# Patient Record
Sex: Female | Born: 1950
Health system: Southern US, Community
[De-identification: ages and names within clinical notes are randomized; demographics above are authoritative.]

## PROBLEM LIST (undated history)

## (undated) DIAGNOSIS — E039 Hypothyroidism, unspecified: Secondary | ICD-10-CM

## (undated) DIAGNOSIS — K219 Gastro-esophageal reflux disease without esophagitis: Secondary | ICD-10-CM

## (undated) DIAGNOSIS — R51 Headache: Secondary | ICD-10-CM

## (undated) DIAGNOSIS — D649 Anemia, unspecified: Secondary | ICD-10-CM

## (undated) DIAGNOSIS — S0990XA Unspecified injury of head, initial encounter: Secondary | ICD-10-CM

## (undated) DIAGNOSIS — M549 Dorsalgia, unspecified: Secondary | ICD-10-CM

## (undated) DIAGNOSIS — Z5189 Encounter for other specified aftercare: Secondary | ICD-10-CM

## (undated) DIAGNOSIS — M1990 Unspecified osteoarthritis, unspecified site: Secondary | ICD-10-CM

## (undated) HISTORY — DX: Anemia, unspecified: D64.9

---
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
## 2008-04-13 ENCOUNTER — Ambulatory Visit (HOSPITAL_COMMUNITY): Admission: RE | Admit: 2008-04-13 | Discharge: 2008-04-13 | Payer: Self-pay | Admitting: Obstetrics and Gynecology

## 2011-01-09 ENCOUNTER — Emergency Department (HOSPITAL_COMMUNITY)
Admission: EM | Admit: 2011-01-09 | Discharge: 2011-01-09 | Disposition: A | Discharge status: Home / Self Care | Payer: No Typology Code available for payment source | Attending: Emergency Medicine | Admitting: Emergency Medicine

## 2011-01-09 ENCOUNTER — Emergency Department (HOSPITAL_COMMUNITY): Payer: No Typology Code available for payment source

## 2011-01-09 DIAGNOSIS — M549 Dorsalgia, unspecified: Secondary | ICD-10-CM | POA: Insufficient documentation

## 2011-01-09 DIAGNOSIS — E039 Hypothyroidism, unspecified: Secondary | ICD-10-CM | POA: Insufficient documentation

## 2011-01-09 DIAGNOSIS — IMO0002 Reserved for concepts with insufficient information to code with codable children: Secondary | ICD-10-CM | POA: Insufficient documentation

## 2011-01-09 DIAGNOSIS — M545 Low back pain, unspecified: Secondary | ICD-10-CM | POA: Insufficient documentation

## 2011-01-09 DIAGNOSIS — R079 Chest pain, unspecified: Secondary | ICD-10-CM | POA: Insufficient documentation

## 2011-01-09 DIAGNOSIS — T148XXA Other injury of unspecified body region, initial encounter: Secondary | ICD-10-CM | POA: Insufficient documentation

## 2011-01-09 DIAGNOSIS — R51 Headache: Secondary | ICD-10-CM | POA: Insufficient documentation

## 2011-01-09 DIAGNOSIS — M542 Cervicalgia: Secondary | ICD-10-CM | POA: Insufficient documentation

## 2011-02-05 ENCOUNTER — Other Ambulatory Visit: Payer: Self-pay

## 2011-03-24 ENCOUNTER — Other Ambulatory Visit (HOSPITAL_COMMUNITY)
Admission: RE | Admit: 2011-03-24 | Discharge: 2011-03-24 | Disposition: A | Discharge status: Home / Self Care | Payer: BC Managed Care – PPO | Source: Ambulatory Visit | Attending: Endocrinology | Admitting: Endocrinology

## 2011-03-24 ENCOUNTER — Other Ambulatory Visit: Payer: Self-pay | Admitting: Endocrinology

## 2011-03-24 DIAGNOSIS — Z01419 Encounter for gynecological examination (general) (routine) without abnormal findings: Secondary | ICD-10-CM | POA: Insufficient documentation

## 2011-06-01 ENCOUNTER — Encounter (HOSPITAL_COMMUNITY)
Admission: RE | Admit: 2011-06-01 | Discharge: 2011-06-01 | Disposition: A | Discharge status: Home / Self Care | Payer: BC Managed Care – PPO | Source: Ambulatory Visit | Attending: Orthopedic Surgery | Admitting: Orthopedic Surgery

## 2011-06-01 ENCOUNTER — Encounter (HOSPITAL_COMMUNITY): Payer: Self-pay

## 2011-06-01 DIAGNOSIS — Z01812 Encounter for preprocedural laboratory examination: Secondary | ICD-10-CM | POA: Insufficient documentation

## 2011-06-01 DIAGNOSIS — Z538 Procedure and treatment not carried out for other reasons: Secondary | ICD-10-CM | POA: Insufficient documentation

## 2011-06-01 HISTORY — DX: Headache: R51

## 2011-06-01 HISTORY — DX: Hypothyroidism, unspecified: E03.9

## 2011-06-01 HISTORY — DX: Gastro-esophageal reflux disease without esophagitis: K21.9

## 2011-06-01 HISTORY — DX: Encounter for other specified aftercare: Z51.89

## 2011-06-01 HISTORY — DX: Unspecified osteoarthritis, unspecified site: M19.90

## 2011-06-01 HISTORY — DX: Dorsalgia, unspecified: M54.9

## 2011-06-01 HISTORY — DX: Unspecified injury of head, initial encounter: S09.90XA

## 2011-06-01 NOTE — Pre-Procedure Instructions (Signed)
PT ARRIVED TODAY FOR HER PREOP VISIT--SURGERY SCHEDULED FOR MON 06/08/11.  PT STATES SHE SAW DR. Juleen China THURS 05/28/11 FOR SURGICAL CLEARANCE--BUT HER BLOOD PRESSURE WAS ELEVATED AND SHE WAS NOT GIVEN CLEARANCE.  STATES SHE IS TO RETURN TO DR. Marylen Ponto OFFICE WED 2/27 FOR RECHECK OF HER BLOOD PRESSURE.  PT STATES SHE DOES NOT WANT ANY LAB TESTING DONE TODAY --THAT SHOULD HER SURGERY BE CANCELLED SHE DOES NOT WANT TO HAVE TO PAY FOR LAB TESTING.  PT'S MEDICAL HX WAS OBTAINED AND PT GIVEN PREOP INSTRUCTIONS FOR DAY OF SURGERY AND RX FOR MUPIROCIN OINTMENT (IF SHE HAS POSITIVE PCR).  PT TO CALL ME IF SHE GETS CLEARANCE--AND SET UP APPOINTMENT TO DO HER PRE OP LAB TESTING.

## 2011-06-01 NOTE — Patient Instructions (Signed)
20 Jasmine Donovan  06/01/2011   Your procedure is scheduled on:  Monday 3/4  AT 7:30 AM  Report to [facility] at 5:30 AM.  Call this number if you have problems the morning of surgery: [phone]   Remember:   Do not eat food OR DRINK ANYTHING AFTER MIDNIGHT THE NIGHT BEFORE YOUR SURGERY.    Take these medicines the morning of surgery with A SIP OF WATER: LEVOTHYROXINE   Do not wear jewelry, make-up or nail polish.  Do not wear lotions, powders, or perfumes.   Do not shave 48 hours prior to surgery.  Do not bring valuables to the hospital.  Contacts, dentures or bridgework may not be worn into surgery.  Leave suitcase in the car. After surgery it may be brought to your room.  For patients admitted to the hospital, checkout time is 11:00 AM the day of discharge.   Patients discharged the day of surgery will not be allowed to drive home  Special Instructions: CHG Shower Use Special Wash: 1/2 bottle night before surgery and 1/2 bottle morning of surgery.   Please read over the following fact sheets that you were given: Blood Transfusion Information and MRSA Information AND INCENTIVE SPIROMETER INFORMATION    CALL [name] RN  AT [facility] AT [phone]  AND LET PAT KNOW IF YOU GET CLEARANCE FOR YOUR SURGERY--AND MAKE APPOINTMENT TO COME BACK TO PRESURGICAL TESTING AT THE HOSPITAL FOR YOUR LAB TESTING.

## 2011-06-02 NOTE — Pre-Procedure Instructions (Signed)
  NOTICE RECEIVED FROM OR THAT PT'S SURGERY FOR 06/08/11 HAS BEEN CANCELLED. DR. Marylen Ponto OFFICE HAD FAXED CXR REPORT FROM 03/24/11 AND EKG REPORT FROM 05/28/2011--BOTH PLACED ON PT'S CHART.

## 2011-06-08 ENCOUNTER — Encounter (HOSPITAL_COMMUNITY): Admission: RE | Payer: Self-pay | Source: Ambulatory Visit

## 2011-06-08 ENCOUNTER — Inpatient Hospital Stay (HOSPITAL_COMMUNITY)
Admission: RE | Admit: 2011-06-08 | Payer: BC Managed Care – PPO | Source: Ambulatory Visit | Admitting: Orthopedic Surgery

## 2011-06-08 SURGERY — ARTHROPLASTY, KNEE, TOTAL
Anesthesia: Spinal | Site: Knee | Laterality: Right

## 2012-09-05 ENCOUNTER — Other Ambulatory Visit: Payer: Self-pay | Admitting: Endocrinology

## 2012-09-05 DIAGNOSIS — M7989 Other specified soft tissue disorders: Secondary | ICD-10-CM

## 2012-09-06 ENCOUNTER — Ambulatory Visit
Admission: RE | Admit: 2012-09-06 | Discharge: 2012-09-06 | Disposition: A | Discharge status: Home / Self Care | Payer: BC Managed Care – PPO | Source: Ambulatory Visit | Attending: Endocrinology | Admitting: Endocrinology

## 2012-09-06 DIAGNOSIS — M7989 Other specified soft tissue disorders: Secondary | ICD-10-CM

## 2012-09-21 ENCOUNTER — Other Ambulatory Visit: Payer: Self-pay | Admitting: Endocrinology

## 2012-09-21 DIAGNOSIS — R079 Chest pain, unspecified: Secondary | ICD-10-CM

## 2012-09-22 ENCOUNTER — Other Ambulatory Visit: Payer: Self-pay | Admitting: *Deleted

## 2012-09-22 DIAGNOSIS — R609 Edema, unspecified: Secondary | ICD-10-CM
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
## 2012-10-17 ENCOUNTER — Encounter: Payer: Self-pay | Admitting: Vascular Surgery

## 2012-10-18 ENCOUNTER — Encounter (INDEPENDENT_AMBULATORY_CARE_PROVIDER_SITE_OTHER): Payer: BC Managed Care – PPO | Admitting: Vascular Surgery

## 2012-10-18 ENCOUNTER — Encounter: Payer: Self-pay | Admitting: Vascular Surgery

## 2012-10-18 ENCOUNTER — Ambulatory Visit (INDEPENDENT_AMBULATORY_CARE_PROVIDER_SITE_OTHER): Payer: BC Managed Care – PPO | Admitting: Vascular Surgery

## 2012-10-18 VITALS — BP 167/85 | HR 63 | Resp 16 | Ht 65.5 in | Wt 187.0 lb

## 2012-10-18 DIAGNOSIS — R609 Edema, unspecified: Secondary | ICD-10-CM

## 2012-10-18 DIAGNOSIS — M79609 Pain in unspecified limb: Secondary | ICD-10-CM | POA: Insufficient documentation

## 2012-10-18 DIAGNOSIS — I83893 Varicose veins of bilateral lower extremities with other complications: Secondary | ICD-10-CM
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
## 2012-12-08 ENCOUNTER — Encounter: Payer: Self-pay | Admitting: Endocrinology
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
## 2012-12-26 ENCOUNTER — Encounter (HOSPITAL_COMMUNITY): Payer: Self-pay | Admitting: Pharmacy Technician

## 2012-12-28 NOTE — Patient Instructions (Signed)
Jawanda Passey  12/28/2012   Your procedure is scheduled on:  01/03/13               Surgery 0900am-1010am  Report to [facility] at     0600  AM.  Call this number if you have problems the morning of surgery: [phone]   Remember:   Do not eat food or drink liquids after midnight.   Take these medicines the morning of surgery with A SIP OF WATER:    Do not wear jewelry, make-up or nail polish.  Do not wear lotions, powders, or perfumes.  Do not shave 48 hours prior to surgery.   Do not bring valuables to the hospital.  Contacts, dentures or bridgework may not be worn into surgery.  Leave suitcase in the car. After surgery it may be brought to your room.  For patients admitted to the hospital, checkout time is 11:00 AM the day of  discharge.       SEE CHG INSTRUCTION SHEET    Please read over the following fact sheets that you were given: MRSA Information, coughing and deep breathing exercises, leg exercises, Blood Transfusion Fact Sheet, Incentive Spirometry Fact Sheet                Failure to comply with these instructions may result in cancellation of your surgery.                Patient Signature ____________________________              Nurse Signature _____________________________

## 2012-12-29 ENCOUNTER — Encounter (HOSPITAL_COMMUNITY): Payer: Self-pay

## 2012-12-29 ENCOUNTER — Encounter (HOSPITAL_COMMUNITY)
Admission: RE | Admit: 2012-12-29 | Discharge: 2012-12-29 | Disposition: A | Discharge status: Home / Self Care | Payer: BC Managed Care – PPO | Source: Ambulatory Visit | Attending: Orthopedic Surgery | Admitting: Orthopedic Surgery

## 2012-12-29 DIAGNOSIS — Z01812 Encounter for preprocedural laboratory examination: Secondary | ICD-10-CM | POA: Insufficient documentation

## 2012-12-29 DIAGNOSIS — Z01818 Encounter for other preprocedural examination: Secondary | ICD-10-CM | POA: Insufficient documentation

## 2012-12-29 LAB — CBC
HCT: 38.1 % (ref 36.0–46.0)
Hemoglobin: 12.4 g/dL (ref 12.0–15.0)
MCH: 28.4 pg (ref 26.0–34.0)
MCHC: 32.5 g/dL (ref 30.0–36.0)

## 2012-12-29 LAB — BASIC METABOLIC PANEL
BUN: 14 mg/dL (ref 6–23)
Chloride: 103 mEq/L (ref 96–112)
GFR calc Af Amer: 83 mL/min — ABNORMAL LOW (ref >90)
Glucose, Bld: 95 mg/dL (ref 70–99)
Potassium: 4.1 mEq/L (ref 3.5–5.1)

## 2012-12-29 LAB — ABO/RH: ABO/RH(D): A NEG

## 2012-12-29 LAB — URINALYSIS, ROUTINE W REFLEX MICROSCOPIC
Ketones, ur: NEGATIVE mg/dL
Leukocytes, UA: NEGATIVE
Nitrite: NEGATIVE
Protein, ur: NEGATIVE mg/dL
Urobilinogen, UA: 0.2 mg/dL (ref 0.0–1.0)
pH: 7 (ref 5.0–8.0)

## 2012-12-29 LAB — PROTIME-INR: INR: 0.91 (ref 0.00–1.49)

## 2012-12-30 NOTE — Progress Notes (Signed)
Patient's husband called and stated had not heard from office regarding results of PCR nasal swab.  Informed patient's husband that I would call office and refax results to office.  Refaxed PCR results to office by [facility] fax and also called office and office personnel when called [phone] prompt 2 then prompt 1 would notify Dr Charlann Boxer since he was in office today.

## 2013-01-03 ENCOUNTER — Ambulatory Visit (HOSPITAL_COMMUNITY): Payer: BC Managed Care – PPO | Admitting: Certified Registered Nurse Anesthetist

## 2013-01-03 ENCOUNTER — Encounter (HOSPITAL_COMMUNITY)
Admission: RE | Disposition: A | Discharge status: Skilled Nursing Facility | Payer: Self-pay | Source: Ambulatory Visit | Attending: Orthopedic Surgery

## 2013-01-03 ENCOUNTER — Inpatient Hospital Stay (HOSPITAL_COMMUNITY)
Admission: RE | Admit: 2013-01-03 | Discharge: 2013-01-06 | DRG: 209 | Disposition: A | Discharge status: Skilled Nursing Facility | Payer: BC Managed Care – PPO | Source: Ambulatory Visit | Attending: Orthopedic Surgery | Admitting: Orthopedic Surgery

## 2013-01-03 ENCOUNTER — Encounter (HOSPITAL_COMMUNITY): Payer: Self-pay | Admitting: Certified Registered Nurse Anesthetist

## 2013-01-03 ENCOUNTER — Encounter (HOSPITAL_COMMUNITY): Payer: Self-pay | Admitting: *Deleted

## 2013-01-03 DIAGNOSIS — M1710 Unilateral primary osteoarthritis, unspecified knee: Principal | ICD-10-CM | POA: Diagnosis present

## 2013-01-03 DIAGNOSIS — E669 Obesity, unspecified: Secondary | ICD-10-CM | POA: Diagnosis present

## 2013-01-03 DIAGNOSIS — D62 Acute posthemorrhagic anemia: Secondary | ICD-10-CM | POA: Diagnosis not present

## 2013-01-03 DIAGNOSIS — Z96651 Presence of right artificial knee joint: Secondary | ICD-10-CM

## 2013-01-03 DIAGNOSIS — Z96659 Presence of unspecified artificial knee joint: Secondary | ICD-10-CM

## 2013-01-03 DIAGNOSIS — D500 Iron deficiency anemia secondary to blood loss (chronic): Secondary | ICD-10-CM | POA: Diagnosis not present

## 2013-01-03 DIAGNOSIS — I10 Essential (primary) hypertension: Secondary | ICD-10-CM | POA: Diagnosis present

## 2013-01-03 DIAGNOSIS — Z880 Allergy status to penicillin: Secondary | ICD-10-CM

## 2013-01-03 DIAGNOSIS — Z6830 Body mass index (BMI) 30.0-30.9, adult: Secondary | ICD-10-CM

## 2013-01-03 DIAGNOSIS — K219 Gastro-esophageal reflux disease without esophagitis: Secondary | ICD-10-CM | POA: Diagnosis present

## 2013-01-03 DIAGNOSIS — E039 Hypothyroidism, unspecified: Secondary | ICD-10-CM | POA: Diagnosis present

## 2013-01-03 HISTORY — PX: TOTAL KNEE ARTHROPLASTY: SHX125

## 2013-01-03 LAB — TYPE AND SCREEN

## 2013-01-03 SURGERY — ARTHROPLASTY, KNEE, TOTAL
Anesthesia: Spinal | Site: Knee | Laterality: Right | Wound class: Clean

## 2013-01-03 MED ORDER — CLINDAMYCIN PHOSPHATE 900 MG/50ML IV SOLN
INTRAVENOUS | Status: AC
  Filled 2013-01-03: qty 50

## 2013-01-03 MED ORDER — METHOCARBAMOL 100 MG/ML IJ SOLN
500.0000 mg | Freq: Four times a day (QID) | INTRAVENOUS | Status: DC | PRN
  Administered 2013-01-03: 15:00:00 500 mg via INTRAVENOUS
  Filled 2013-01-03: qty 5

## 2013-01-03 MED ORDER — CLINDAMYCIN PHOSPHATE 900 MG/50ML IV SOLN
900.0000 mg | INTRAVENOUS | Status: AC
  Administered 2013-01-03: 900 mg via INTRAVENOUS

## 2013-01-03 MED ORDER — LIDOCAINE HCL (CARDIAC) 20 MG/ML IV SOLN
INTRAVENOUS | Status: DC | PRN
  Administered 2013-01-03: 100 mg via INTRAVENOUS

## 2013-01-03 MED ORDER — DEXAMETHASONE SODIUM PHOSPHATE 10 MG/ML IJ SOLN
10.0000 mg | Freq: Once | INTRAMUSCULAR | Status: AC
  Administered 2013-01-04: 08:00:00 10 mg via INTRAVENOUS
  Filled 2013-01-03: qty 1

## 2013-01-03 MED ORDER — PHENOL 1.4 % MT LIQD
1.0000 | OROMUCOSAL | Status: DC | PRN
  Filled 2013-01-03: qty 177

## 2013-01-03 MED ORDER — SODIUM CHLORIDE 0.9 % IV SOLN
INTRAVENOUS | Status: DC
  Administered 2013-01-03 – 2013-01-04 (×2): via INTRAVENOUS
  Filled 2013-01-03 (×13): qty 1000

## 2013-01-03 MED ORDER — ONDANSETRON HCL 4 MG/2ML IJ SOLN
INTRAMUSCULAR | Status: DC | PRN
  Administered 2013-01-03: 4 mg via INTRAVENOUS

## 2013-01-03 MED ORDER — TRANEXAMIC ACID 100 MG/ML IV SOLN
1000.0000 mg | Freq: Once | INTRAVENOUS | Status: AC
  Administered 2013-01-03: 1000 mg via INTRAVENOUS
  Filled 2013-01-03: qty 10

## 2013-01-03 MED ORDER — DEXAMETHASONE SODIUM PHOSPHATE 10 MG/ML IJ SOLN
10.0000 mg | Freq: Once | INTRAMUSCULAR | Status: AC
  Administered 2013-01-03: 10 mg via INTRAVENOUS

## 2013-01-03 MED ORDER — ASPIRIN EC 325 MG PO TBEC
325.0000 mg | DELAYED_RELEASE_TABLET | Freq: Two times a day (BID) | ORAL | Status: DC
  Administered 2013-01-04 – 2013-01-06 (×5): 325 mg via ORAL
  Filled 2013-01-03 (×7): qty 1

## 2013-01-03 MED ORDER — CLINDAMYCIN PHOSPHATE 600 MG/50ML IV SOLN
600.0000 mg | Freq: Four times a day (QID) | INTRAVENOUS | Status: AC
  Administered 2013-01-03 (×2): 600 mg via INTRAVENOUS
  Filled 2013-01-03 (×2): qty 50

## 2013-01-03 MED ORDER — FERROUS SULFATE 325 (65 FE) MG PO TABS
325.0000 mg | ORAL_TABLET | Freq: Three times a day (TID) | ORAL | Status: DC
Start: 2013-01-03 — End: 2013-01-06
  Administered 2013-01-03 – 2013-01-06 (×9): 325 mg via ORAL
  Filled 2013-01-03 (×11): qty 1

## 2013-01-03 MED ORDER — KETOROLAC TROMETHAMINE 30 MG/ML IJ SOLN
INTRAMUSCULAR | Status: AC
  Filled 2013-01-03: qty 1

## 2013-01-03 MED ORDER — HYDROMORPHONE HCL PF 1 MG/ML IJ SOLN: 0.2500 mg | INTRAMUSCULAR | Status: DC | PRN

## 2013-01-03 MED ORDER — SODIUM CHLORIDE 0.9 % IJ SOLN
INTRAMUSCULAR | Status: DC | PRN
  Administered 2013-01-03: 10:00:00

## 2013-01-03 MED ORDER — POLYETHYLENE GLYCOL 3350 17 G PO PACK
17.0000 g | PACK | Freq: Two times a day (BID) | ORAL | Status: DC
  Administered 2013-01-03 – 2013-01-05 (×5): 17 g via ORAL

## 2013-01-03 MED ORDER — CELECOXIB 200 MG PO CAPS
200.0000 mg | ORAL_CAPSULE | Freq: Two times a day (BID) | ORAL | Status: DC
  Administered 2013-01-03 – 2013-01-06 (×6): 200 mg via ORAL
  Filled 2013-01-03 (×8): qty 1

## 2013-01-03 MED ORDER — HYDROCODONE-ACETAMINOPHEN 7.5-325 MG PO TABS
1.0000 | ORAL_TABLET | ORAL | Status: DC
  Administered 2013-01-03 – 2013-01-05 (×12): 2 via ORAL
  Administered 2013-01-06: 05:00:00 1 via ORAL
  Administered 2013-01-06: 2 via ORAL
  Administered 2013-01-06 (×2): 1 via ORAL
  Filled 2013-01-03 (×5): qty 2
  Filled 2013-01-03: qty 1
  Filled 2013-01-03 (×3): qty 2
  Filled 2013-01-03: qty 1
  Filled 2013-01-03: qty 2
  Filled 2013-01-03: qty 1
  Filled 2013-01-03 (×5): qty 2

## 2013-01-03 MED ORDER — LEVOTHYROXINE SODIUM 125 MCG PO TABS
125.0000 ug | ORAL_TABLET | Freq: Every day | ORAL | Status: DC
  Administered 2013-01-04 – 2013-01-06 (×3): 125 ug via ORAL
  Filled 2013-01-03 (×4): qty 1

## 2013-01-03 MED ORDER — BISACODYL 10 MG RE SUPP: 10.0000 mg | Freq: Every day | RECTAL | Status: DC | PRN

## 2013-01-03 MED ORDER — METHOCARBAMOL 500 MG PO TABS
500.0000 mg | ORAL_TABLET | Freq: Four times a day (QID) | ORAL | Status: DC | PRN
  Administered 2013-01-03 – 2013-01-04 (×3): 500 mg via ORAL
  Filled 2013-01-03 (×4): qty 1

## 2013-01-03 MED ORDER — METOCLOPRAMIDE HCL 10 MG PO TABS: 5.0000 mg | ORAL_TABLET | Freq: Three times a day (TID) | ORAL | Status: DC | PRN

## 2013-01-03 MED ORDER — HYDROMORPHONE HCL PF 1 MG/ML IJ SOLN
0.5000 mg | INTRAMUSCULAR | Status: DC | PRN
  Administered 2013-01-03: 23:00:00 1 mg via INTRAVENOUS
  Filled 2013-01-03: qty 1

## 2013-01-03 MED ORDER — PHENYLEPHRINE HCL 10 MG/ML IJ SOLN
INTRAMUSCULAR | Status: DC | PRN
  Administered 2013-01-03: 80 ug via INTRAVENOUS

## 2013-01-03 MED ORDER — LACTATED RINGERS IV SOLN
INTRAVENOUS | Status: DC
  Administered 2013-01-03: 12:00:00 via INTRAVENOUS

## 2013-01-03 MED ORDER — MENTHOL 3 MG MT LOZG
1.0000 | LOZENGE | OROMUCOSAL | Status: DC | PRN
  Filled 2013-01-03: qty 9

## 2013-01-03 MED ORDER — KETOROLAC TROMETHAMINE 30 MG/ML IJ SOLN
INTRAMUSCULAR | Status: DC | PRN
  Administered 2013-01-03: 30 mg

## 2013-01-03 MED ORDER — DOCUSATE SODIUM 100 MG PO CAPS
100.0000 mg | ORAL_CAPSULE | Freq: Two times a day (BID) | ORAL | Status: DC
  Administered 2013-01-03 – 2013-01-06 (×6): 100 mg via ORAL

## 2013-01-03 MED ORDER — PROMETHAZINE HCL 25 MG/ML IJ SOLN: 6.2500 mg | INTRAMUSCULAR | Status: DC | PRN

## 2013-01-03 MED ORDER — BUPIVACAINE-EPINEPHRINE PF 0.25-1:200000 % IJ SOLN
INTRAMUSCULAR | Status: DC | PRN
  Administered 2013-01-03: 25 mL

## 2013-01-03 MED ORDER — ONDANSETRON HCL 4 MG/2ML IJ SOLN
4.0000 mg | Freq: Four times a day (QID) | INTRAMUSCULAR | Status: DC | PRN
  Administered 2013-01-04: 4 mg via INTRAVENOUS
  Filled 2013-01-03: qty 2

## 2013-01-03 MED ORDER — ONDANSETRON HCL 4 MG PO TABS: 4.0000 mg | ORAL_TABLET | Freq: Four times a day (QID) | ORAL | Status: DC | PRN

## 2013-01-03 MED ORDER — ALUM & MAG HYDROXIDE-SIMETH 200-200-20 MG/5ML PO SUSP: 30.0000 mL | ORAL | Status: DC | PRN

## 2013-01-03 MED ORDER — DIPHENHYDRAMINE HCL 25 MG PO CAPS: 25.0000 mg | ORAL_CAPSULE | Freq: Four times a day (QID) | ORAL | Status: DC | PRN

## 2013-01-03 MED ORDER — FLEET ENEMA 7-19 GM/118ML RE ENEM: 1.0000 | ENEMA | Freq: Once | RECTAL | Status: AC | PRN

## 2013-01-03 MED ORDER — LACTATED RINGERS IV SOLN
INTRAVENOUS | Status: DC | PRN
  Administered 2013-01-03: 08:00:00 via INTRAVENOUS

## 2013-01-03 MED ORDER — SODIUM CHLORIDE 0.9 % IJ SOLN
INTRAMUSCULAR | Status: AC
  Filled 2013-01-03: qty 20

## 2013-01-03 MED ORDER — 0.9 % SODIUM CHLORIDE (POUR BTL) OPTIME
TOPICAL | Status: DC | PRN
  Administered 2013-01-03: 1000 mL

## 2013-01-03 MED ORDER — BUPIVACAINE IN DEXTROSE 0.75-8.25 % IT SOLN
INTRATHECAL | Status: DC | PRN
  Administered 2013-01-03: 2 mL via INTRATHECAL

## 2013-01-03 MED ORDER — SODIUM CHLORIDE 0.9 % IR SOLN
Status: DC | PRN
  Administered 2013-01-03: 1000 mL

## 2013-01-03 MED ORDER — BUPIVACAINE LIPOSOME 1.3 % IJ SUSP
20.0000 mL | Freq: Once | INTRAMUSCULAR | Status: DC
  Filled 2013-01-03: qty 20

## 2013-01-03 MED ORDER — MIDAZOLAM HCL 5 MG/5ML IJ SOLN
INTRAMUSCULAR | Status: DC | PRN
  Administered 2013-01-03: 2 mg via INTRAVENOUS

## 2013-01-03 MED ORDER — METOCLOPRAMIDE HCL 5 MG/ML IJ SOLN
5.0000 mg | Freq: Three times a day (TID) | INTRAMUSCULAR | Status: DC | PRN
  Administered 2013-01-04: 13:00:00 10 mg via INTRAVENOUS
  Filled 2013-01-03: qty 2

## 2013-01-03 MED ORDER — ATORVASTATIN CALCIUM 20 MG PO TABS
20.0000 mg | ORAL_TABLET | Freq: Every day | ORAL | Status: DC
  Administered 2013-01-03 – 2013-01-05 (×3): 20 mg via ORAL
  Filled 2013-01-03 (×4): qty 1

## 2013-01-03 MED ORDER — CHLORHEXIDINE GLUCONATE 4 % EX LIQD
60.0000 mL | Freq: Once | CUTANEOUS | Status: DC
  Filled 2013-01-03: qty 60

## 2013-01-03 MED ORDER — PROPOFOL INFUSION 10 MG/ML OPTIME
INTRAVENOUS | Status: DC | PRN
  Administered 2013-01-03: 140 ug/kg/min via INTRAVENOUS

## 2013-01-03 MED ORDER — BUPIVACAINE-EPINEPHRINE PF 0.25-1:200000 % IJ SOLN
INTRAMUSCULAR | Status: AC
  Filled 2013-01-03: qty 30

## 2013-01-03 MED ORDER — ZOLPIDEM TARTRATE 5 MG PO TABS: 5.0000 mg | ORAL_TABLET | Freq: Every evening | ORAL | Status: DC | PRN

## 2013-01-03 SURGICAL SUPPLY — 58 items
ADH SKN CLS APL DERMABOND .7 (GAUZE/BANDAGES/DRESSINGS) ×1
BAG SPEC THK2 15X12 ZIP CLS (MISCELLANEOUS) ×1
BAG ZIPLOCK 12X15 (MISCELLANEOUS) ×2 IMPLANT
BANDAGE ELASTIC 6 VELCRO ST LF (GAUZE/BANDAGES/DRESSINGS) ×2 IMPLANT
BANDAGE ESMARK 6X9 LF (GAUZE/BANDAGES/DRESSINGS) ×1 IMPLANT
BLADE SAW SGTL 13.0X1.19X90.0M (BLADE) ×2 IMPLANT
BNDG CMPR 9X6 STRL LF SNTH (GAUZE/BANDAGES/DRESSINGS) ×1
BNDG ESMARK 6X9 LF (GAUZE/BANDAGES/DRESSINGS) ×2
BOWL SMART MIX CTS (DISPOSABLE) ×2 IMPLANT
CAPT RP KNEE ×1 IMPLANT
CEMENT HV SMART SET (Cement) ×2 IMPLANT
CLOTH BEACON ORANGE TIMEOUT ST (SAFETY) ×2 IMPLANT
CUFF TOURN SGL QUICK 34 (TOURNIQUET CUFF) ×2
CUFF TRNQT CYL 34X4X40X1 (TOURNIQUET CUFF) ×1 IMPLANT
DECANTER SPIKE VIAL GLASS SM (MISCELLANEOUS) ×2 IMPLANT
DERMABOND ADVANCED (GAUZE/BANDAGES/DRESSINGS) ×1
DERMABOND ADVANCED .7 DNX12 (GAUZE/BANDAGES/DRESSINGS) ×1 IMPLANT
DRAPE EXTREMITY T 121X128X90 (DRAPE) ×2 IMPLANT
DRAPE POUCH INSTRU U-SHP 10X18 (DRAPES) ×2 IMPLANT
DRAPE U-SHAPE 47X51 STRL (DRAPES) ×2 IMPLANT
DRSG AQUACEL AG ADV 3.5X10 (GAUZE/BANDAGES/DRESSINGS) ×2 IMPLANT
DRSG TEGADERM 4X4.75 (GAUZE/BANDAGES/DRESSINGS) ×2 IMPLANT
DURAPREP 26ML APPLICATOR (WOUND CARE) ×2 IMPLANT
ELECT REM PT RETURN 9FT ADLT (ELECTROSURGICAL) ×2
ELECTRODE REM PT RTRN 9FT ADLT (ELECTROSURGICAL) ×1 IMPLANT
EVACUATOR 1/8 PVC DRAIN (DRAIN) ×2 IMPLANT
FACESHIELD LNG OPTICON STERILE (SAFETY) ×10 IMPLANT
GAUZE SPONGE 2X2 8PLY STRL LF (GAUZE/BANDAGES/DRESSINGS) ×1 IMPLANT
GLOVE BIOGEL PI IND STRL 7.5 (GLOVE) ×1 IMPLANT
GLOVE BIOGEL PI IND STRL 8 (GLOVE) ×1 IMPLANT
GLOVE BIOGEL PI INDICATOR 7.5 (GLOVE) ×1
GLOVE BIOGEL PI INDICATOR 8 (GLOVE) ×1
GLOVE ECLIPSE 8.0 STRL XLNG CF (GLOVE) ×2 IMPLANT
GLOVE ORTHO TXT STRL SZ7.5 (GLOVE) ×4 IMPLANT
GOWN BRE IMP PREV XXLGXLNG (GOWN DISPOSABLE) ×3 IMPLANT
GOWN PREVENTION PLUS LG XLONG (DISPOSABLE) ×3 IMPLANT
HANDPIECE INTERPULSE COAX TIP (DISPOSABLE) ×2
KIT BASIN OR (CUSTOM PROCEDURE TRAY) ×2 IMPLANT
MANIFOLD NEPTUNE II (INSTRUMENTS) ×2 IMPLANT
NDL SAFETY ECLIPSE 18X1.5 (NEEDLE) ×1 IMPLANT
NEEDLE HYPO 18GX1.5 SHARP (NEEDLE) ×2
NS IRRIG 1000ML POUR BTL (IV SOLUTION) ×3 IMPLANT
PACK TOTAL JOINT (CUSTOM PROCEDURE TRAY) ×2 IMPLANT
POSITIONER SURGICAL ARM (MISCELLANEOUS) ×2 IMPLANT
SET HNDPC FAN SPRY TIP SCT (DISPOSABLE) ×1 IMPLANT
SET PAD KNEE POSITIONER (MISCELLANEOUS) ×2 IMPLANT
SPONGE GAUZE 2X2 STER 10/PKG (GAUZE/BANDAGES/DRESSINGS) ×1
SUCTION FRAZIER 12FR DISP (SUCTIONS) ×2 IMPLANT
SUT MNCRL AB 4-0 PS2 18 (SUTURE) ×2 IMPLANT
SUT VIC AB 1 CT1 36 (SUTURE) ×2 IMPLANT
SUT VIC AB 2-0 CT1 27 (SUTURE) ×6
SUT VIC AB 2-0 CT1 TAPERPNT 27 (SUTURE) ×3 IMPLANT
SUT VLOC 180 0 24IN GS25 (SUTURE) ×1 IMPLANT
SYR 50ML LL SCALE MARK (SYRINGE) ×2 IMPLANT
TOWEL OR 17X26 10 PK STRL BLUE (TOWEL DISPOSABLE) ×4 IMPLANT
TRAY FOLEY CATH 14FRSI W/METER (CATHETERS) ×2 IMPLANT
WATER STERILE IRR 1500ML POUR (IV SOLUTION) ×3 IMPLANT
WRAP KNEE MAXI GEL POST OP (GAUZE/BANDAGES/DRESSINGS) ×2 IMPLANT

## 2013-01-03 NOTE — Evaluation (Signed)
Physical Therapy Evaluation Patient Details Name: Jasmine Donovan MRN: [id number] DOB: April 24, 1950 Today's Date: 01/03/2013 Time: 4098-1191 PT Time Calculation (min): 30 min  PT Assessment / Plan / Recommendation History of Present Illness  RTKA on 01/03/13  Clinical Impression  Pt tolerated mobilizing to edge of  Bed and taking a few steps. Pt will benefit from PT to address problems listed below. Recommend ST rehab as pt will need to be at a modified independent level at DC.    PT Assessment  Patient needs continued PT services    Follow Up Recommendations  SNF;Supervision/Assistance - 24 hour    Does the patient have the potential to tolerate intense rehabilitation      Barriers to Discharge Decreased caregiver support spouse recently ahd back surgery and unable to assist.    Equipment Recommendations  None recommended by PT    Recommendations for Other Services     Frequency 7X/week    Precautions / Restrictions Precautions Precautions: Knee   Pertinent Vitals/Pain premdicated w/ pain+5 during weight bear.      Mobility  Bed Mobility Bed Mobility: Supine to Sit;Sitting - Scoot to Edge of Bed;Sit to Supine Supine to Sit: 3: Mod assist;HOB elevated;With rails Sitting - Scoot to Edge of Bed: 4: Min assist Sit to Supine: 3: Mod assist;HOB flat Details for Bed Mobility Assistance: requires assistanc e to move/support RLE and assit getting trunk into upright positon. Assitance for RLE  placed back onto bed. Transfers Transfers: Sit to Stand;Stand to Sit Sit to Stand: 3: Mod assist;From elevated surface;With upper extremity assist;From bed Stand to Sit: 4: Min assist;To elevated surface;To bed;With upper extremity assist Details for Transfer Assistance: multimodal cues for hand palcement and position RLE prior to sitting down. Ambulation/Gait Ambulation/Gait Assistance: 3: Mod assist Ambulation Distance (Feet): 5 Feet (side steps along the bed.) Assistive device: Rolling  walker Ambulation/Gait Assistance Details: multimodal cues for increasing weight through RLE, Gait Pattern: Antalgic;Step-to pattern;Decreased step length - right;Decreased stance time - right    Exercises Total Joint Exercises Straight Leg Raises: AAROM;Right;5 reps;Supine Knee Flexion: AROM;Right;5 reps;Seated   PT Diagnosis: Difficulty walking;Acute pain  PT Problem List: Decreased strength;Decreased range of motion;Decreased activity tolerance;Decreased mobility;Decreased knowledge of use of DME;Decreased safety awareness;Decreased knowledge of precautions;Pain PT Treatment Interventions: DME instruction;Gait training;Stair training;Functional mobility training;Therapeutic activities;Therapeutic exercise;Patient/family education     PT Goals(Current goals can be found in the care plan section) Acute Rehab PT Goals Patient Stated Goal: I want to get this one done, then the other. PT Goal Formulation: With patient/family Time For Goal Achievement: 01/10/13 Potential to Achieve Goals: Good  Visit Information  Last PT Received On: 01/03/13 Assistance Needed: +2 History of Present Illness: RTKA on 01/03/13       Prior Functioning  Home Living Family/patient expects to be discharged to:: Private residence (plans SNF) Living Arrangements: Spouse/significant other Available Help at Discharge: Family Type of Home: House Home Access: Stairs to enter Secretary/administrator of Steps: 4 Entrance Stairs-Rails: None Home Layout: One level Home Equipment: Environmental consultant - 2 wheels Prior Function Level of Independence: Independent Communication Communication: No difficulties    Cognition  Cognition Arousal/Alertness: Awake/alert Behavior During Therapy: WFL for tasks assessed/performed Overall Cognitive Status: Within Functional Limits for tasks assessed    Extremity/Trunk Assessment Upper Extremity Assessment Upper Extremity Assessment: Defer to OT evaluation Lower Extremity  Assessment Lower Extremity Assessment: RLE deficits/detail RLE Deficits / Details: performs SLR with min assist. knee flexion 40 degrees.   Balance    End of  Session PT - End of Session Activity Tolerance: Patient limited by fatigue;Patient limited by pain Patient left: in bed;with call bell/phone within reach;with family/visitor present Nurse Communication: Mobility status (to check catheter lacement.)  GP     Rada Hay 01/03/2013, 6:20 PM Blanchard Kelch PT [phone]

## 2013-01-03 NOTE — Op Note (Addendum)
NAME:  Jasmine Donovan                      MEDICAL RECORD NO.:  [id number]                             FACILITY:  [facility]      PHYSICIAN:  Madlyn Frankel. Charlann Boxer, M.D.  DATE OF BIRTH:  08/20/1950      DATE OF PROCEDURE:  01/03/2013                                     OPERATIVE REPORT         PREOPERATIVE DIAGNOSIS:  Right knee osteoarthritis.      POSTOPERATIVE DIAGNOSIS:  Right knee osteoarthritis.      FINDINGS:  The patient was noted to have complete loss of cartilage and   bone-on-bone arthritis with associated osteophytes in the patellofemoral and lateral compartments of   the knee with a significant synovitis and associated effusion.      PROCEDURE:  Right total knee replacement.      COMPONENTS USED:  DePuy rotating platform posterior stabilized knee   system, a size 3 femur, 3 tibia, 10 PS mm insert, and 38 patellar   button.      SURGEON:  Madlyn Frankel. Charlann Boxer, M.D.      ASSISTANT:  Lanney Gins, PA-C.      ANESTHESIA:  Spinal.      SPECIMENS:  None.      COMPLICATION:  None.      DRAINS:  One Hemovac.  EBL: <100cc      TOURNIQUET TIME:   Total Tourniquet Time Documented: Thigh (Right) - 32 minutes Total: Thigh (Right) - 31 minutes  .      The patient was stable to the recovery room.      INDICATION FOR PROCEDURE:  Jasmine Donovan is a 62 y.o. female patient of   mine.  The patient had been seen, evaluated, and treated conservatively in the   office with medication, activity modification, and injections.  The patient had   radiographic changes of bone-on-bone arthritis with endplate sclerosis and osteophytes noted.      The patient failed conservative measures including medication, injections, and activity modification, and at this point was ready for more definitive measures.   Based on the radiographic changes and failed conservative measures, the patient   decided to proceed with total knee replacement.  Risks of infection,   DVT, component failure, need for  revision surgery, postop course, and   expectations were all   discussed and reviewed.  Consent was obtained for benefit of pain   relief.      PROCEDURE IN DETAIL:  The patient was brought to the operative theater.   Once adequate anesthesia, preoperative antibiotics, 900mg  of Clindamycin administered, the patient was positioned supine with the right thigh tourniquet placed.  The  right lower extremity was prepped and draped in sterile fashion.  A time-   out was performed identifying the patient, planned procedure, and   extremity.      The right lower extremity was placed in the [facility] leg holder.  The leg was   exsanguinated, tourniquet elevated to 250 mmHg.  A midline incision was   made followed by median parapatellar arthrotomy.  Following initial   exposure, attention was first directed to  the patella.  Precut   measurement was noted to be 21 mm.  I resected down to 14 mm and used a   38 patellar button to restore patellar height as well as cover the cut   surface.      The lug holes were drilled and a metal shim was placed to protect the   patella from retractors and saw blades.      At this point, attention was now directed to the femur.  The femoral   canal was opened with a drill, irrigated to try to prevent fat emboli.  An   intramedullary rod was passed at 3 degrees valgus, 10 mm of bone was   resected off the distal femur.  Following this resection, the tibia was   subluxated anteriorly.  Using the extramedullary guide, 10 mm of bone was resected off   the proximal lateral tibia.  We confirmed the gap would be   stable medially and laterally with a 10 mm insert as well as confirmed   the cut was perpendicular in the coronal plane, checking with an alignment rod.      Once this was done, I sized the femur to be a size 3 in the anterior-   posterior dimension, chose a standard component based on medial and   lateral dimension.  The size 3 rotation block was then pinned  in   position anterior referenced using the C-clamp to set rotation.  The   anterior, posterior, and  chamfer cuts were made without difficulty nor   notching making certain that I was along the anterior cortex to help   with flexion gap stability.      The final box cut was made off the lateral aspect of distal femur.      At this point, the tibia was sized to be a size 3, the size 3 tray was   then pinned in position through the medial third of the tubercle,   drilled, and keel punched.  Trial reduction was now carried with a 3 femur,  3 tibia, a 10 mm insert, and the 38 patella botton.  The knee was brought to   extension, full extension with good flexion stability with the patella   tracking through the trochlea without application of pressure.  Given   all these findings, the trial components removed.  Final components were   opened and cement was mixed.  The knee was irrigated with normal saline   solution and pulse lavage.  The synovial lining was   then injected with 0.25% Marcaine with epinephrine and 1 cc of Toradol,   total of 61 cc.      The knee was irrigated.  Final implants were then cemented onto clean and   dried cut surfaces of bone with the knee brought to extension with a 10 mm trial insert.      Once the cement had fully cured, the excess cement was removed   throughout the knee.  I confirmed I was satisfied with the range of   motion and stability, and the final 10 mm PS insert was chosen.  It was   placed into the knee.      The tourniquet had been let down at 31 minutes.  No significant   hemostasis required.  The medium Hemovac drain was placed deep.  The   extensor mechanism was then reapproximated using #1 Vicryl with the knee   in flexion.  The   remaining wound  was closed with 2-0 Vicryl and running 4-0 Monocryl.   The knee was cleaned, dried, dressed sterilely using Dermabond and   Aquacel dressing.  Drain site dressed separately.  The patient was then    brought to recovery room in stable condition, tolerating the procedure   well.   Please note that Physician Assistant, Lanney Gins, was present for the entirety of the case, and was utilized for pre-operative positioning, peri-operative retractor management, general facilitation of the procedure.  He was also utilized for primary wound closure at the end of the case.              Madlyn Frankel Charlann Boxer, M.D.

## 2013-01-03 NOTE — Anesthesia Procedure Notes (Signed)
Spinal  Patient location during procedure: OR Staffing Anesthesiologist: Azell Der Performed by: anesthesiologist  Preanesthetic Checklist Completed: patient identified, site marked, surgical consent, pre-op evaluation, timeout performed, IV checked, risks and benefits discussed and monitors and equipment checked Spinal Block Patient position: sitting Prep: Betadine Patient monitoring: heart rate, continuous pulse ox and blood pressure Approach: midline Location: L4-5 Injection technique: single-shot Needle Needle type: Sprotte  Needle gauge: 24 G Needle length: 9 cm Additional Notes Expiration date of kit checked and confirmed. Patient tolerated procedure well, without complications. No paresthesia. CSF clear.

## 2013-01-03 NOTE — Progress Notes (Signed)
Clinical Social Work Department BRIEF PSYCHOSOCIAL ASSESSMENT 01/03/2013  Patient:  Jasmine Donovan     Account Number:  [id number]     Admit date:  01/03/2013  Clinical Social Worker:  Jasmine Donovan  Date/Time:  01/03/2013 01:38 PM  Referred by:  Physician  Date Referred:  01/03/2013 Referred for  SNF Placement   Other Referral:   Interview type:  Patient Other interview type:    PSYCHOSOCIAL DATA Living Status:  HUSBAND Admitted from facility:   Level of care:   Primary support name:  Jasmine Donovan Primary support relationship to patient:   Degree of support available:   unclear    CURRENT CONCERNS Current Concerns  Post-Acute Placement   Other Concerns:    SOCIAL WORK ASSESSMENT / PLAN Pt is a 62 yr old female living at home prior to hospitalization. CSW met with pt / family to assist with d/c planning. Pt may need ST Rehab following hospital d/c. Pt states she has made prior arrangements to have SNF placement at [facility] . SNF contacted and confirmation is pending. Pt has BCBS which requires prior approval. CSW will assist with authorization process.   Assessment/plan status:  Psychosocial Support/Ongoing Assessment of Needs Other assessment/ plan:   Information/referral to community resources:   Insurance coverage for SNF reviewed.    PATIENT'S/FAMILY'S RESPONSE TO PLAN OF CARE: Pt hopes she can return home with [facility] services but will accept placement at [facility] if rehab is needed.   Jasmine Razor LCSW [phone]

## 2013-01-03 NOTE — Anesthesia Preprocedure Evaluation (Signed)
Anesthesia Evaluation  Patient identified by MRN, date of birth, ID band Patient awake    Reviewed: Allergy & Precautions, H&P , NPO status , Patient's Chart, lab work & pertinent test results  Airway Mallampati: II TM Distance: >3 FB Neck ROM: Full    Dental no notable dental hx.    Pulmonary neg pulmonary ROS,  CXR 07-20-12 normal. breath sounds clear to auscultation  Pulmonary exam normal       Cardiovascular + Peripheral Vascular Disease negative cardio ROS  Rhythm:Regular Rate:Normal     Neuro/Psych  Headaches, negative psych ROS   GI/Hepatic Neg liver ROS, GERD-  ,  Endo/Other  Hypothyroidism   Renal/GU negative Renal ROS  negative genitourinary   Musculoskeletal negative musculoskeletal ROS (+)   Abdominal (+) + obese,   Peds negative pediatric ROS (+)  Hematology negative hematology ROS (+)   Anesthesia Other Findings   Reproductive/Obstetrics negative OB ROS                           Anesthesia Physical Anesthesia Plan  ASA: II  Anesthesia Plan: Spinal   Post-op Pain Management:    Induction:   Airway Management Planned:   Additional Equipment:   Intra-op Plan:   Post-operative Plan:   Informed Consent: I have reviewed the patients History and Physical, chart, labs and discussed the procedure including the risks, benefits and alternatives for the proposed anesthesia with the patient or authorized representative who has indicated his/her understanding and acceptance.   Dental advisory given  Plan Discussed with: CRNA  Anesthesia Plan Comments: (Discussed risks/benefits of spinal including headache, backache, failure, bleeding, infection, and nerve damage. Patient consents to spinal. Questions answered. Coagulation studies and platelet count acceptable.)        Anesthesia Quick Evaluation

## 2013-01-03 NOTE — Plan of Care (Signed)
Problem: Consults Goal: Diagnosis- Total Joint Replacement Right total knee     

## 2013-01-03 NOTE — Transfer of Care (Signed)
Immediate Anesthesia Transfer of Care Note  Patient: Jasmine Donovan  Procedure(s) Performed: Procedure(s) (LRB): RIGHT TOTAL KNEE ARTHROPLASTY (Right)  Patient Location: PACU  Anesthesia Type: Spinal  Level of Consciousness: sedated, patient cooperative and responds to stimulation  Airway & Oxygen Therapy: Patient Spontanous Breathing and Patient connected to face mask oxgen  Post-op Assessment: Report given to PACU RN and Post -op Vital signs reviewed and stable  Post vital signs: Reviewed and stable  Complications: No apparent anesthesia complications

## 2013-01-03 NOTE — Preoperative (Signed)
Beta Blockers   Reason not to administer Beta Blockers:Not Applicable 

## 2013-01-03 NOTE — Anesthesia Postprocedure Evaluation (Signed)
  Anesthesia Post-op Note  Patient: Jasmine Donovan  Procedure(s) Performed: Procedure(s) (LRB): RIGHT TOTAL KNEE ARTHROPLASTY (Right)  Patient Location: PACU  Anesthesia Type: sab  Level of Consciousness: awake and alert   Airway and Oxygen Therapy: Patient Spontanous Breathing  Post-op Pain: mild  Post-op Assessment: Post-op Vital signs reviewed, Patient's Cardiovascular Status Stable, Respiratory Function Stable, Patent Airway and No signs of Nausea or vomiting  Last Vitals:  Filed Vitals:   01/03/13 1155  BP:   Pulse: 50  Temp:   Resp: 12    Post-op Vital Signs: stable   Complications: No apparent anesthesia complications

## 2013-01-03 NOTE — Progress Notes (Signed)
Clinical Social Work Department CLINICAL SOCIAL WORK PLACEMENT NOTE 01/03/2013  Patient:  [facility]  Account Number:  [id number] Admit date:  01/03/2013  Clinical Social Worker:  [name], LCSW  Date/time:  01/03/2013 12:00 M  Clinical Social Work is seeking post-discharge placement for this patient at the following level of care:   SKILLED NURSING   (*CSW will update this form in Epic as items are completed)     Patient/family provided with [facility] Department of Clinical Social Work's list of facilities offering this level of care within the geographic area requested by the patient (or if unable, by the patient's family).  01/03/2013  Patient/family informed of their freedom to choose among providers that offer the needed level of care, that participate in Medicare, Medicaid or managed care program needed by the patient, have an available bed and are willing to accept the patient.    Patient/family informed of [facility]' ownership interest in [facility], as well as of the fact that they are under no obligation to receive care at this facility.  PASARR submitted to EDS on 01/03/2013 PASARR number received from [employer] on 01/03/2013  FL2 transmitted to all facilities in geographic area requested by pt/family on  01/03/2013 FL2 transmitted to all facilities within larger geographic area on   Patient informed that his/her managed care company has contracts with or will negotiate with  certain facilities, including the following:     Patient/family informed of bed offers received:   Patient chooses bed at  Physician recommends and patient chooses bed at    Patient to be transferred to  on   Patient to be transferred to facility by   The following physician request were entered in Epic:   Additional Comments:  [name] LCSW [phone]

## 2013-01-04 ENCOUNTER — Encounter (HOSPITAL_COMMUNITY): Payer: Self-pay | Admitting: Orthopedic Surgery

## 2013-01-04 DIAGNOSIS — E669 Obesity, unspecified: Secondary | ICD-10-CM | POA: Diagnosis present

## 2013-01-04 DIAGNOSIS — D500 Iron deficiency anemia secondary to blood loss (chronic): Secondary | ICD-10-CM | POA: Diagnosis not present

## 2013-01-04 LAB — BASIC METABOLIC PANEL
BUN: 11 mg/dL (ref 6–23)
CO2: 24 mEq/L (ref 19–32)
Calcium: 8.4 mg/dL (ref 8.4–10.5)
Chloride: 105 mEq/L (ref 96–112)
GFR calc non Af Amer: >90 mL/min (ref >90)
Glucose, Bld: 194 mg/dL — ABNORMAL HIGH (ref 70–99)
Potassium: 4 mEq/L (ref 3.5–5.1)
Sodium: 137 mEq/L (ref 135–145)

## 2013-01-04 LAB — CBC
HCT: 30.3 % — ABNORMAL LOW (ref 36.0–46.0)
Hemoglobin: 9.8 g/dL — ABNORMAL LOW (ref 12.0–15.0)
MCH: 28.2 pg (ref 26.0–34.0)
MCHC: 32.3 g/dL (ref 30.0–36.0)
Platelets: 191 10*3/uL (ref 150–400)
RBC: 3.48 MIL/uL — ABNORMAL LOW (ref 3.87–5.11)
WBC: 15.7 10*3/uL — ABNORMAL HIGH (ref 4.0–10.5)

## 2013-01-04 NOTE — Evaluation (Signed)
Occupational Therapy Evaluation Patient Details Name: Jasmine Donovan MRN: [id number] DOB: 1950-12-01 Today's Date: 01/04/2013 Time: 4098-1191 OT Time Calculation (min): 14 min  OT Assessment / Plan / Recommendation History of present illness RTKA on 01/03/13   Clinical Impression   Pt presents to OT s/ p TKR. Pt will benefit from skilled OT to increase I with ADL activity as pt must be I to return home    OT Assessment  Patient needs continued OT Services    Follow Up Recommendations  SNF       Equipment Recommendations  None recommended by OT          Precautions / Restrictions Restrictions Weight Bearing Restrictions: No       ADL  Grooming: Set up Where Assessed - Grooming: Unsupported sitting Where Assessed - Upper Body Dressing: Unsupported sitting Lower Body Dressing: Set up Where Assessed - Lower Body Dressing: Supported sit to stand Toilet Transfer: Moderate assistance Toilet Transfer Method: Sit to stand Toileting - Clothing Manipulation and Hygiene: Minimal assistance Where Assessed - Toileting Clothing Manipulation and Hygiene: Standing    OT Diagnosis: Generalized weakness  OT Problem List: Decreased strength;Pain OT Treatment Interventions: Patient/family education   OT Goals(Current goals can be found in the care plan section) Acute Rehab OT Goals Patient Stated Goal: I want to get this one done, then the other. OT Goal Formulation: With patient Time For Goal Achievement: 01/18/13  Visit Information  Last OT Received On: 01/04/13 Assistance Needed: +1 History of Present Illness: RTKA on 01/03/13       Prior Functioning     Home Living Family/patient expects to be discharged to:: Private residence (plans SNF) Living Arrangements: Spouse/significant other Available Help at Discharge: Family Type of Home: House Home Access: Stairs to enter Secretary/administrator of Steps: 4 Entrance Stairs-Rails: None Home Layout: One level Home Equipment:  Environmental consultant - 2 wheels Prior Function Level of Independence: Independent Communication Communication: No difficulties            Cognition  Cognition Arousal/Alertness: Awake/alert Behavior During Therapy: WFL for tasks assessed/performed Overall Cognitive Status: Within Functional Limits for tasks assessed    Extremity/Trunk Assessment Upper Extremity Assessment Upper Extremity Assessment: Generalized weakness              End of Session OT - End of Session Activity Tolerance: Patient tolerated treatment well Patient left: in chair  GO     Lamberto Dinapoli, Metro Kung 01/04/2013, 12:29 PM

## 2013-01-04 NOTE — Progress Notes (Signed)
Physical Therapy Treatment Patient Details Name: Elaynah Virginia MRN: [id number] DOB: [birth year] Today's Date: 01/04/2013 Time: 4696-2952 PT Time Calculation (min): 36 min  PT Assessment / Plan / Recommendation  History of Present Illness RTKA on 01/03/13   PT Comments   Pt had just had episode of emesis but consented to participate. No further nausea. Pt moves very slowly at this time but is motivated. Pt will benefit from ST rehab as pt will need to be mod. Independent.   Follow Up Recommendations  SNF;Supervision/Assistance - 24 hour     Does the patient have the potential to tolerate intense rehabilitation     Barriers to Discharge        Equipment Recommendations  None recommended by PT    Recommendations for Other Services    Frequency 7X/week   Progress towards PT Goals Progress towards PT goals: Progressing toward goals  Plan Current plan remains appropriate    Precautions / Restrictions Precautions Precautions: Knee Restrictions Weight Bearing Restrictions: No   Pertinent Vitals/Pain 5-6 R knee with weight. Has been medicated. Ice applied.    Mobility  Bed Mobility Bed Mobility: Not assessed Transfers Sit to Stand: With upper extremity assist;From chair/3-in-1;3: Mod assist Stand to Sit: With upper extremity assist;To chair/3-in-1;3: Mod assist Details for Transfer Assistance: multimodal cues for hand palcement and position RLE prior to sitting down.Pt requires assistance for support of RLE  during transition fromsitting to stand to sitting down, Extra time required for mobilizing. Ambulation/Gait Ambulation/Gait Assistance: 3: Mod assist;4: Min assist Ambulation Distance (Feet): 80 Feet Assistive device: Rolling walker Ambulation/Gait Assistance Details: mulitmodal cues for sequence, posture ans positioninside RW, tends to get too clos to front of RW. Initally pt unsteady. gradual improvement during ambulation for sequence. Gait Pattern: Antalgic;Step-to  pattern;Decreased step length - right;Decreased stance time - right;Trunk flexed Gait velocity: slow    Exercises Total Joint Exercises Quad Sets: AROM;Right;10 reps;Supine   PT Diagnosis:    PT Problem List:   PT Treatment Interventions:     PT Goals (current goals can now be found in the care plan section) Acute Rehab PT Goals Patient Stated Goal: I want to get this one done, then the other.  Visit Information  Last PT Received On: 01/04/13 Assistance Needed: +1 History of Present Illness: RTKA on 01/03/13    Subjective Data  Patient Stated Goal: I want to get this one done, then the other.   Cognition  Cognition Arousal/Alertness: Awake/alert Behavior During Therapy: WFL for tasks assessed/performed Overall Cognitive Status: Within Functional Limits for tasks assessed    Balance     End of Session PT - End of Session Activity Tolerance: Patient limited by fatigue;Patient limited by pain Patient left: in chair;with call bell/phone within reach Nurse Communication: Mobility status   GP     Rada Hay 01/04/2013, 2:33 PM Blanchard Kelch PT [phone]

## 2013-01-04 NOTE — Care Management Note (Signed)
    Page 1 of 1   01/05/2013     4:58:04 PM   CARE MANAGEMENT NOTE 01/05/2013  Patient:  Jasmine Donovan   Account Number:  [id number]  Date Initiated:  01/04/2013  Documentation initiated by:  Colleen Can  Subjective/Objective Assessment:   dx rt knee osteoarthritis; total knee replacemnt     Action/Plan:   SNF rehab. CM referral. CM will follow as needed.   Anticipated DC Date:  01/06/2013   Anticipated DC Plan:  SKILLED NURSING FACILITY  In-house referral  Clinical Social Worker      DC Planning Services  CM consult      Choice offered to / List presented to:             Status of service:  Completed, signed off Medicare Important Message given?   (If response is "NO", the following Medicare IM given date fields will be blank) Date Medicare IM given:   Date Additional Medicare IM given:    Discharge Disposition:  SKILLED NURSING FACILITY  Per UR Regulation:  Reviewed for med. necessity/level of care/duration of stay  If discussed at Long Length of Stay Meetings, dates discussed:    Comments:

## 2013-01-04 NOTE — Progress Notes (Signed)
   Subjective: 1 Day Post-Op Procedure(s) (LRB): RIGHT TOTAL KNEE ARTHROPLASTY (Right)   Patient reports pain as mild, pain well controlled. No events throughout the night.  Objective:   VITALS:   Filed Vitals:   01/04/13 0702  BP: 108/57  Pulse: 66  Temp: 97.8 F (36.6 C)  Resp: 16    Neurovascular intact Dorsiflexion/Plantar flexion intact Incision: dressing C/D/I No cellulitis present Compartment soft  LABS  Recent Labs  01/04/13 0425  HGB 9.8*  HCT 30.3*  WBC 15.7*  PLT 191     Recent Labs  01/04/13 0425  NA 137  K 4.0  BUN 11  CREATININE 0.65  GLUCOSE 194*     Assessment/Plan: 1 Day Post-Op Procedure(s) (LRB): RIGHT TOTAL KNEE ARTHROPLASTY (Right) Foley cath d/c'ed HV drain d/c'ed Advance diet Up with therapy D/C IV fluids Discharge home with home health vs SNF, depending on progress.  Expected ABLA  Treated with iron and will observe  Obese (BMI 30-39.9) Estimated body mass index is 30.04 kg/(m^2) as calculated from the following:   Height as of this encounter: 5\' 6"  (1.676 m).   Weight as of this encounter: 84.369 kg (186 lb). Patient also counseled that weight may inhibit the healing process Patient counseled that losing weight will help with future health issues       Anastasio Auerbach. Sarissa Dern   PAC  01/04/2013, 9:00 AM

## 2013-01-04 NOTE — Progress Notes (Signed)
Physical Therapy Treatment Patient Details Name: Jasmine Donovan MRN: [id number] DOB: [birth year] Today's Date: 01/04/2013 Time: 1344-1400 PT Time Calculation (min): 16 min  PT Assessment / Plan / Recommendation  History of Present Illness RTKA on 01/03/13   PT Comments   Slowly improving. Continues to require verbal cues for safety and sequence.  Follow Up Recommendations  SNF;Supervision/Assistance - 24 hour     Does the patient have the potential to tolerate intense rehabilitation     Barriers to Discharge        Equipment Recommendations  None recommended by PT    Recommendations for Other Services    Frequency 7X/week   Progress towards PT Goals Progress towards PT goals: Progressing toward goals  Plan Current plan remains appropriate    Precautions / Restrictions Precautions Precautions: Knee Restrictions Weight Bearing Restrictions: No   Pertinent Vitals/Pain 6 R knee.ice applied. Premedicated.   Mobility  Bed Mobility Bed Mobility: Not assessed Sit to Supine: 3: Mod assist Details for Bed Mobility Assistance: multimodal cues for technique  to angle body on edge of bed and continuously turn. Mod assist to support RLE and place onto bed.  Transfers Sit to Stand: With upper extremity assist;From chair/3-in-1;4: Min assist Stand to Sit: With upper extremity assist;To bed;4: Min assist Details for Transfer Assistance: multimodal cues for hand palcement and push from recliner /reach to bed and position RLE prior to sitting down.Pt requires assistance for support of RLE  during transition from sitting to stand ., Extra time required for mobilizing. Ambulation/Gait Ambulation/Gait Assistance: 4: Min assist Ambulation Distance (Feet): 50 Feet Assistive device: Rolling walker Ambulation/Gait Assistance Details: cues for safe use of RW and sequence. Pt reports more pain and fatigue this PM. Gait Pattern: Antalgic;Step-to pattern;Decreased step length - right;Decreased stance  time - right;Trunk flexed Gait velocity: slow    Exercises Total Joint Exercises Ankle Circles/Pumps: AROM;Both;10 reps Quad Sets: AROM;Right;10 reps;Supine Heel Slides: AAROM;Right;10 reps;Supine Hip ABduction/ADduction: AAROM;Right;Supine;10 reps Straight Leg Raises: AAROM;Right;10 reps;Supine Goniometric ROM: 10-50 knee on R   PT Diagnosis:    PT Problem List:   PT Treatment Interventions:     PT Goals (current goals can now be found in the care plan section) Acute Rehab PT Goals Patient Stated Goal: I want to get this one done, then the other.  Visit Information  Last PT Received On: 01/04/13 Assistance Needed: +1 History of Present Illness: RTKA on 01/03/13    Subjective Data  Patient Stated Goal: I want to get this one done, then the other.   Cognition  Cognition Arousal/Alertness: Awake/alert Behavior During Therapy: WFL for tasks assessed/performed Overall Cognitive Status: Within Functional Limits for tasks assessed    Balance     End of Session PT - End of Session Activity Tolerance: Patient limited by fatigue;Patient limited by pain Patient left: in bed;with call bell/phone within reach;with family/visitor present Nurse Communication: Mobility status   GP     Rada Hay 01/04/2013, 2:39 PM

## 2013-01-05 LAB — BASIC METABOLIC PANEL
BUN: 9 mg/dL (ref 6–23)
CO2: 27 mEq/L (ref 19–32)
Calcium: 8.4 mg/dL (ref 8.4–10.5)
Creatinine, Ser: 0.69 mg/dL (ref 0.50–1.10)
GFR calc Af Amer: >90 mL/min (ref >90)
GFR calc non Af Amer: >90 mL/min (ref >90)
Glucose, Bld: 111 mg/dL — ABNORMAL HIGH (ref 70–99)

## 2013-01-05 LAB — CBC
HCT: 31.3 % — ABNORMAL LOW (ref 36.0–46.0)
Hemoglobin: 10.4 g/dL — ABNORMAL LOW (ref 12.0–15.0)
MCH: 29 pg (ref 26.0–34.0)
MCHC: 33.2 g/dL (ref 30.0–36.0)
MCV: 87.2 fL (ref 78.0–100.0)
Platelets: 214 10*3/uL (ref 150–400)
RBC: 3.59 MIL/uL — ABNORMAL LOW (ref 3.87–5.11)
WBC: 16.4 10*3/uL — ABNORMAL HIGH (ref 4.0–10.5)

## 2013-01-05 MED ORDER — FERROUS SULFATE 325 (65 FE) MG PO TABS: 325.0000 mg | ORAL_TABLET | Freq: Three times a day (TID) | ORAL | Status: DC

## 2013-01-05 MED ORDER — ASPIRIN 325 MG PO TBEC: 325.0000 mg | DELAYED_RELEASE_TABLET | Freq: Two times a day (BID) | ORAL | Status: AC

## 2013-01-05 MED ORDER — POLYETHYLENE GLYCOL 3350 17 G PO PACK: 17.0000 g | PACK | Freq: Two times a day (BID) | ORAL | Status: DC

## 2013-01-05 MED ORDER — HYDROCODONE-ACETAMINOPHEN 7.5-325 MG PO TABS: 1.0000 | ORAL_TABLET | ORAL | Status: DC | PRN

## 2013-01-05 MED ORDER — DSS 100 MG PO CAPS: 100.0000 mg | ORAL_CAPSULE | Freq: Two times a day (BID) | ORAL | Status: DC

## 2013-01-05 MED ORDER — METHOCARBAMOL 500 MG PO TABS: 500.0000 mg | ORAL_TABLET | Freq: Four times a day (QID) | ORAL | Status: DC | PRN

## 2013-01-05 NOTE — Progress Notes (Signed)
   Subjective: 2 Days Post-Op Procedure(s) (LRB): RIGHT TOTAL KNEE ARTHROPLASTY (Right)   Patient reports pain as mild, pain well controlled. No events throughout the night.   Objective:   VITALS:   Filed Vitals:   01/05/13 0435  BP: 143/76  Pulse: 74  Temp: 98.3 F (36.8 C)  Resp: 16    Neurovascular intact Dorsiflexion/Plantar flexion intact Incision: dressing C/D/I No cellulitis present Compartment soft  LABS  Recent Labs  01/04/13 0425 01/05/13 0705  HGB 9.8* 10.4*  HCT 30.3* 31.3*  WBC 15.7* 16.4*  PLT 191 214     Recent Labs  01/04/13 0425 01/05/13 0705  NA 137 136  K 4.0 3.9  BUN 11 9  CREATININE 0.65 0.69  GLUCOSE 194* 111*     Assessment/Plan: 2 Days Post-Op Procedure(s) (LRB): RIGHT TOTAL KNEE ARTHROPLASTY (Right) Up with therapy Discharge to SNF eventually, when ready  Expected ABLA  Treated with iron and will observe   Obese (BMI 30-39.9)  Estimated body mass index is 30.04 kg/(m^2) as calculated from the following:      Height as of this encounter: 5\' 6"  (1.676 m).      Weight as of this encounter: 84.369 kg (186 lb).  Patient also counseled that weight may inhibit the healing process  Patient counseled that losing weight will help with future health issues        Anastasio Auerbach. Kimberley Speece   PAC  01/05/2013, 7:52 AM
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
## 2013-01-05 NOTE — Progress Notes (Signed)
Physical Therapy Treatment Patient Details Name: Jasmine Donovan MRN: [id number] DOB: April 06, 1951 Today's Date: 01/05/2013 Time: 4098-1191 PT Time Calculation (min): 31 min  PT Assessment / Plan / Recommendation  History of Present Illness RTKA on 01/03/13   PT Comments   Pt. Recently medicated but reports pain at 9 when it flexes and during sit/stand. Encouraged to place leg forward to decrease forced flexion. Pt slowly progressing. Continues to require moderate assistance for transition activities. Recommend SNF rehab.  Follow Up Recommendations  SNF;Supervision/Assistance - 24 hour     Does the patient have the potential to tolerate intense rehabilitation     Barriers to Discharge        Equipment Recommendations  None recommended by PT    Recommendations for Other Services    Frequency 7X/week   Progress towards PT Goals Progress towards PT goals: Progressing toward goals  Plan Current plan remains appropriate    Precautions / Restrictions Precautions Precautions: Knee   Pertinent Vitals/Pain 8-9 , ice applied R knee.    Mobility  Bed Mobility Bed Mobility: Supine to Sit Supine to Sit: 4: Min assist;HOB elevated;With rails Sitting - Scoot to Edge of Bed: 4: Min assist Sit to Supine: 3: Mod assist Details for Bed Mobility Assistance: multimodal cues for technique  to angle body on edge of bed and continuously turn. Mod assist to support RLE and place onto bed. Transfers Sit to Stand: 4: Min assist;3: Mod assist;From bed;From toilet;From elevated surface;With upper extremity assist Stand to Sit: To bed;To toilet Details for Transfer Assistance: multimodal cues for hand palcement and push from recliner /reach to bed and position RLE prior to sitting down.Pt requires assistance for support of RLE  during transition from sitting to stand ., Extra time required for mobilizing.Much more difficulty stabding from low surface as L knee is due for TKA in  future. Ambulation/Gait Ambulation/Gait Assistance: 4: Min assist Ambulation Distance (Feet): 20 Feet (x 2) Assistive device: Rolling walker Ambulation/Gait Assistance Details: frequent cues for sequence.Pt has delay for following  instructions at times. Cues for safe use of RW. Gait Pattern: Antalgic;Step-to pattern;Decreased step length - right;Decreased stance time - right;Trunk flexed Gait velocity: slow    Exercises     PT Diagnosis:    PT Problem List:   PT Treatment Interventions:     PT Goals (current goals can now be found in the care plan section)    Visit Information  Last PT Received On: 01/05/13 Assistance Needed: +1 History of Present Illness: RTKA on 01/03/13    Subjective Data      Cognition  Cognition Arousal/Alertness: Awake/alert    Balance     End of Session PT - End of Session Activity Tolerance: Patient limited by fatigue;Patient limited by pain Patient left: in bed;with call bell/phone within reach;with family/visitor present Nurse Communication: Mobility status   GP     Rada Hay 01/05/2013, 9:08 AM Blanchard Kelch PT [phone]

## 2013-01-05 NOTE — Progress Notes (Signed)
Physical Therapy Treatment Patient Details Name: Jasmine Donovan MRN: [id number] DOB: 12/02/1950 Today's Date: 01/05/2013 Time: 4098-1191 PT Time Calculation (min): 25 min  PT Assessment / Plan / Recommendation  History of Present Illness     PT Comments   Pt is slowly improving. Continues to requirelifting assistance from surfaces at times. Plans SNF tomorrow.  Follow Up Recommendations  SNF;Supervision/Assistance - 24 hour     Does the patient have the potential to tolerate intense rehabilitation     Barriers to Discharge        Equipment Recommendations  None recommended by PT    Recommendations for Other Services    Frequency 7X/week   Progress towards PT Goals Progress towards PT goals: Progressing toward goals  Plan Current plan remains appropriate    Precautions / Restrictions Precautions Precautions: Knee   Pertinent Vitals/Pain 6 R knee walking. Due meds soon.    Mobility  Bed Mobility Bed Mobility: Supine to Sit Supine to Sit: 4: Min assist;HOB elevated;With rails Sitting - Scoot to Edge of Bed: 4: Min assist Transfers Sit to Stand: 4: Min assist;3: Mod assist;From bed;From toilet;From elevated surface;With upper extremity assist Stand to Sit: To toilet;4: Min assist;To chair/3-in-1 Details for Transfer Assistance: multimodal cues for hand palcement and push from recliner /reach to bed and position RLE prior to sitting down.Pt requires assistance for support of RLE  during transition from sitting to stand ., Extra time required for mobilizing.Much more difficulty stabding from low surface as L knee is due for TKA in future. Ambulation/Gait Ambulation/Gait Assistance: 4: Min assist Ambulation Distance (Feet): 150 Feet Gait Pattern: Antalgic;Step-to pattern;Decreased step length - right;Decreased stance time - right;Trunk flexed Gait velocity: slow    Exercises     PT Diagnosis:    PT Problem List:   PT Treatment Interventions:     PT Goals (current goals  can now be found in the care plan section)    Visit Information  Last PT Received On: 01/05/13 Assistance Needed: +1    Subjective Data      Cognition  Cognition Arousal/Alertness: Awake/alert    Balance     End of Session PT - End of Session Activity Tolerance: Patient tolerated treatment well Patient left: in chair;with family/visitor present   GP     Rada Hay 01/05/2013, 1:44 PM

## 2013-01-06 NOTE — Progress Notes (Signed)
CSW assisting with d/c planning. Jasmine Donovan has authorization from [place] for placement but has not yet been able to connect with negotiation dept in order to proceed with admission. CSW will assist SNF with this process.  [name] LCSW [phone]

## 2013-01-06 NOTE — Progress Notes (Signed)
Physical Therapy Treatment Patient Details Name: Jasmine Donovan MRN: [id number] DOB: 05-08-1950 Today's Date: 01/06/2013 Time: 4098-1191 PT Time Calculation (min): 27 min  PT Assessment / Plan / Recommendation  History of Present Illness RTKA on 01/03/13   PT Comments   POD # 3 am session.  Pt c/o increased fatigue and pain level "today vs yesterday".. Assisted pt OOB to amb to BR to void then amb back to bed per pt request.  Performed TKR TE's then applied ICE.     Follow Up Recommendations  SNF     Does the patient have the potential to tolerate intense rehabilitation     Barriers to Discharge        Equipment Recommendations  None recommended by PT    Recommendations for Other Services    Frequency 7X/week   Progress towards PT Goals Progress towards PT goals: Progressing toward goals  Plan      Precautions / Restrictions Precautions Precautions: Knee Restrictions Weight Bearing Restrictions: No    Pertinent Vitals/Pain C/o 7/10 ICE applied    Mobility  Bed Mobility Bed Mobility: Supine to Sit;Sit to Supine Supine to Sit: 4: Min assist;HOB elevated;With rails Sitting - Scoot to Edge of Bed: 4: Min assist Sit to Supine: 3: Mod assist Details for Bed Mobility Assistance: required increased time and 25% VC's on proper tech and hand placement Transfers Transfers: Sit to Stand;Stand to Sit Sit to Stand: 4: Min assist;3: Mod assist;From bed;From toilet;From elevated surface;With upper extremity assist Stand to Sit: To toilet;4: Min assist;To chair/3-in-1 Details for Transfer Assistance: 25% VC's on safety with turns and increased time due to pain level.   Ambulation/Gait Ambulation/Gait Assistance: 4: Min assist Ambulation Distance (Feet): 30 Feet (15' x 2 to and from BR) Assistive device: Rolling walker Ambulation/Gait Assistance Details: decreased amb distance this session 2nd MAX c/o fatigue and increased c/o pain.  Amb to and from BR then assisted back to bed per  pt request.   Gait Pattern: Antalgic;Step-to pattern;Decreased step length - right;Decreased stance time - right;Trunk flexed Gait velocity: slow    Exercises   Total Knee Replacement TE's 10 reps B LE ankle pumps 10 reps knee presses 10 reps heel slides  10 reps SAQ's 10 reps SLR's 10 reps ABD Followed by ICE    PT Goals (current goals can now be found in the care plan section)    Visit Information  Last PT Received On: 01/06/13 Assistance Needed: +1 History of Present Illness: RTKA on 01/03/13    Subjective Data      Cognition       Balance     End of Session PT - End of Session Equipment Utilized During Treatment: Gait belt Activity Tolerance: Patient tolerated treatment well   Felecia Shelling  PTA [facility]  Acute  Rehab Pager      [phone]

## 2013-01-06 NOTE — Progress Notes (Signed)
   Subjective: 3 Days Post-Op Procedure(s) (LRB): RIGHT TOTAL KNEE ARTHROPLASTY (Right)   Patient reports pain as mild, pain well controlled. No events throughout the night. Ready to be discharged to SNF.  Objective:   VITALS:   Filed Vitals:   01/06/13 0527  BP: 169/81  Pulse: 76  Temp: 98.1 F (36.7 C)  Resp: 16    Neurovascular intact Dorsiflexion/Plantar flexion intact Incision: dressing C/D/I No cellulitis present Compartment soft  LABS  Recent Labs  01/04/13 0425 01/05/13 0705  HGB 9.8* 10.4*  HCT 30.3* 31.3*  WBC 15.7* 16.4*  PLT 191 214     Recent Labs  01/04/13 0425 01/05/13 0705  NA 137 136  K 4.0 3.9  BUN 11 9  CREATININE 0.65 0.69  GLUCOSE 194* 111*     Assessment/Plan: 3 Days Post-Op Procedure(s) (LRB): RIGHT TOTAL KNEE ARTHROPLASTY (Right) Up with therapy Discharge to SNF, [facility] Follow up in 2 weeks at [facility]. Follow up with OLIN,Daton Szilagyi D in 2 weeks.  Contact information:  [facility] [address], [address] [place] [address] [phone]         Anastasio Auerbach. Peyten Weare   PAC  01/06/2013, 7:53 AM

## 2013-01-06 NOTE — Progress Notes (Signed)
Discharge summary sent to payer through [employer]  

## 2013-01-06 NOTE — Progress Notes (Signed)
Clinical Social Work Department CLINICAL SOCIAL WORK PLACEMENT NOTE 01/06/2013  Patient:  [facility]  Account Number:  [id number] Admit date:  01/03/2013  Clinical Social Worker:  [name], LCSW  Date/time:  01/03/2013 12:00 M  Clinical Social Work is seeking post-discharge placement for this patient at the following level of care:   SKILLED NURSING   (*CSW will update this form in Epic as items are completed)     Patient/family provided with [facility] Department of Clinical Social Work's list of facilities offering this level of care within the geographic area requested by the patient (or if unable, by the patient's family).  01/03/2013  Patient/family informed of their freedom to choose among providers that offer the needed level of care, that participate in Medicare, Medicaid or managed care program needed by the patient, have an available bed and are willing to accept the patient.    Patient/family informed of [facility]' ownership interest in [facility], as well as of the fact that they are under no obligation to receive care at this facility.  PASARR submitted to [employer] on 01/03/2013 PASARR number received from [employer] on 01/03/2013  FL2 transmitted to all facilities in geographic area requested by pt/family on  01/03/2013 FL2 transmitted to all facilities within larger geographic area on   Patient informed that his/her managed care company has contracts with or will negotiate with  certain facilities, including the following:     Patient/family informed of bed offers received:  01/04/2013 Patient chooses bed at [facility] Physician recommends and patient chooses bed at    Patient to be transferred to [facility] on  01/06/2013 Patient to be transferred to facility by FAMILY  The following physician request were entered in Epic:   Additional Comments:  [employer] prior authorization for SNF placement. [facility] accepted pt prior to negotiating  rate.  [name] LCSW [phone]

## 2013-01-06 NOTE — Progress Notes (Signed)
Pt d/c'd to [facility]. Facility called twice and was unable to reach the nurse.

## 2013-01-20 ENCOUNTER — Non-Acute Institutional Stay (SKILLED_NURSING_FACILITY): Payer: BC Managed Care – PPO | Admitting: Nurse Practitioner

## 2013-01-20 DIAGNOSIS — K5900 Constipation, unspecified: Secondary | ICD-10-CM

## 2013-01-20 DIAGNOSIS — E039 Hypothyroidism, unspecified: Secondary | ICD-10-CM

## 2013-01-20 DIAGNOSIS — Z96659 Presence of unspecified artificial knee joint: Secondary | ICD-10-CM
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
## 2013-03-01 NOTE — Patient Instructions (Addendum)
20      Your procedure is scheduled on:  Tuesday 03/14/2013  Report to [facility] at  [phone] AM.  Call this number if you have problems the night before or morning of surgery: [phone]   Remember:             IF YOU USE CPAP,BRING MASK AND TUBING AM OF SURGERY!             IF YOU DO NOT HAVE YOUR TYPE AND SCREEN DRAWN AT PRE-ADMIT APPOINTMENT, YOU WILL HAVE IT DRAWN AM OF SURGERY!   Do not eat food or drink liquids AFTER MIDNIGHT!  Take these medicines the morning of surgery with A SIP OF WATER: Synthroid, Hydrocodone-Acetaminophen if needed    [facility] IS NOT RESPONSIBLE FOR ANY BELONGINGS OR VALUABLES BROUGHT TO HOSPITAL.  Marland Kitchen  Leave suitcase in the car. After surgery it may be brought to your room.  For patients admitted to the hospital, checkout time is 11:00 AM the day of              Discharge.    DO NOT WEAR JEWELRY,MAKE-UP,LOTIONS,POWDERS,PERFUMES,CONTACTS , DENTURES OR BRIDGEWORK ,AND DO NOT WEAR FALSE EYELASHES                                    Patients discharged the day of surgery will not be allowed to drive home. If going home the same day of surgery, must have someone stay with you first 24 hrs.at home and arrange for someone to drive you home from the Hospital.                        YOUR DRIVER IS:N/A   Special Instructions:              Please read over the following fact sheets that you were given:             1. [place] PREPARING FOR SURGERY SHEET              2.[facility]                                        [place].Baraa Tubbs,RN,BSN     [phone]                FAILURE TO FOLLOW THESE INSTRUCTIONS MAY RESULT IN  CANCELLATION OF YOUR SURGERY!               Patient Signature:___________________________

## 2013-03-03 ENCOUNTER — Encounter (HOSPITAL_COMMUNITY): Payer: Self-pay | Admitting: Pharmacy Technician

## 2013-03-07 ENCOUNTER — Encounter (HOSPITAL_COMMUNITY)
Admission: RE | Admit: 2013-03-07 | Discharge: 2013-03-07 | Disposition: A | Discharge status: Home / Self Care | Payer: BC Managed Care – PPO | Source: Ambulatory Visit | Attending: Orthopedic Surgery | Admitting: Orthopedic Surgery

## 2013-03-07 ENCOUNTER — Encounter (HOSPITAL_COMMUNITY): Payer: Self-pay

## 2013-03-07 ENCOUNTER — Encounter (INDEPENDENT_AMBULATORY_CARE_PROVIDER_SITE_OTHER): Payer: Self-pay

## 2013-03-07 DIAGNOSIS — Z0183 Encounter for blood typing: Secondary | ICD-10-CM | POA: Insufficient documentation

## 2013-03-07 DIAGNOSIS — M1710 Unilateral primary osteoarthritis, unspecified knee: Secondary | ICD-10-CM | POA: Insufficient documentation

## 2013-03-07 DIAGNOSIS — Z01812 Encounter for preprocedural laboratory examination: Secondary | ICD-10-CM | POA: Insufficient documentation

## 2013-03-07 LAB — CBC
MCH: 28.7 pg (ref 26.0–34.0)
MCHC: 32.5 g/dL (ref 30.0–36.0)
Platelets: 266 10*3/uL (ref 150–400)
RDW: 13.8 % (ref 11.5–15.5)
WBC: 8.6 10*3/uL (ref 4.0–10.5)

## 2013-03-07 LAB — URINALYSIS, ROUTINE W REFLEX MICROSCOPIC
Bilirubin Urine: NEGATIVE
Glucose, UA: NEGATIVE mg/dL
Hgb urine dipstick: NEGATIVE
Ketones, ur: NEGATIVE mg/dL
Leukocytes, UA: NEGATIVE
Nitrite: NEGATIVE
Protein, ur: NEGATIVE mg/dL
pH: 6 (ref 5.0–8.0)

## 2013-03-07 LAB — BASIC METABOLIC PANEL
BUN: 11 mg/dL (ref 6–23)
Calcium: 9.4 mg/dL (ref 8.4–10.5)
Creatinine, Ser: 0.74 mg/dL (ref 0.50–1.10)
GFR calc Af Amer: >90 mL/min (ref >90)

## 2013-03-07 LAB — PROTIME-INR
INR: 0.92 (ref 0.00–1.49)
Prothrombin Time: 12.2 seconds (ref 11.6–15.2)

## 2013-03-07 LAB — SURGICAL PCR SCREEN: Staphylococcus aureus: NEGATIVE

## 2013-03-07 LAB — APTT: aPTT: 30 seconds (ref 24–37)

## 2013-03-09 NOTE — Progress Notes (Signed)
Patient notifed of time change to 1335pm-1445pm.  Patient to arrive at 1030am.  Patient instructed on clear liquids until 0700am then npo.  Patient voiced understanding.

## 2013-03-14 ENCOUNTER — Encounter (HOSPITAL_COMMUNITY): Payer: Self-pay | Admitting: *Deleted

## 2013-03-14 ENCOUNTER — Encounter (HOSPITAL_COMMUNITY): Payer: BC Managed Care – PPO | Admitting: Anesthesiology

## 2013-03-14 ENCOUNTER — Ambulatory Visit (HOSPITAL_COMMUNITY): Payer: BC Managed Care – PPO | Admitting: Anesthesiology

## 2013-03-14 ENCOUNTER — Inpatient Hospital Stay (HOSPITAL_COMMUNITY)
Admission: RE | Admit: 2013-03-14 | Discharge: 2013-03-17 | DRG: 470 | Disposition: A | Discharge status: Skilled Nursing Facility | Payer: BC Managed Care – PPO | Source: Ambulatory Visit | Attending: Orthopedic Surgery | Admitting: Orthopedic Surgery

## 2013-03-14 ENCOUNTER — Encounter (HOSPITAL_COMMUNITY)
Admission: RE | Disposition: A | Discharge status: Skilled Nursing Facility | Payer: Self-pay | Source: Ambulatory Visit | Attending: Orthopedic Surgery

## 2013-03-14 DIAGNOSIS — K219 Gastro-esophageal reflux disease without esophagitis: Secondary | ICD-10-CM | POA: Diagnosis present

## 2013-03-14 DIAGNOSIS — D62 Acute posthemorrhagic anemia: Secondary | ICD-10-CM | POA: Diagnosis not present

## 2013-03-14 DIAGNOSIS — Z8249 Family history of ischemic heart disease and other diseases of the circulatory system: Secondary | ICD-10-CM

## 2013-03-14 DIAGNOSIS — Z833 Family history of diabetes mellitus: Secondary | ICD-10-CM

## 2013-03-14 DIAGNOSIS — I83893 Varicose veins of bilateral lower extremities with other complications: Secondary | ICD-10-CM | POA: Diagnosis present

## 2013-03-14 DIAGNOSIS — Z79899 Other long term (current) drug therapy: Secondary | ICD-10-CM

## 2013-03-14 DIAGNOSIS — Z96659 Presence of unspecified artificial knee joint: Secondary | ICD-10-CM

## 2013-03-14 DIAGNOSIS — Z880 Allergy status to penicillin: Secondary | ICD-10-CM

## 2013-03-14 DIAGNOSIS — Z7982 Long term (current) use of aspirin: Secondary | ICD-10-CM

## 2013-03-14 DIAGNOSIS — Z803 Family history of malignant neoplasm of breast: Secondary | ICD-10-CM

## 2013-03-14 DIAGNOSIS — M1710 Unilateral primary osteoarthritis, unspecified knee: Principal | ICD-10-CM | POA: Diagnosis present

## 2013-03-14 DIAGNOSIS — Z87820 Personal history of traumatic brain injury: Secondary | ICD-10-CM

## 2013-03-14 DIAGNOSIS — E669 Obesity, unspecified: Secondary | ICD-10-CM | POA: Diagnosis present

## 2013-03-14 DIAGNOSIS — E039 Hypothyroidism, unspecified: Secondary | ICD-10-CM | POA: Diagnosis present

## 2013-03-14 DIAGNOSIS — D500 Iron deficiency anemia secondary to blood loss (chronic): Secondary | ICD-10-CM | POA: Diagnosis present

## 2013-03-14 DIAGNOSIS — Z6830 Body mass index (BMI) 30.0-30.9, adult: Secondary | ICD-10-CM

## 2013-03-14 DIAGNOSIS — Z96652 Presence of left artificial knee joint: Secondary | ICD-10-CM

## 2013-03-14 HISTORY — PX: TOTAL KNEE ARTHROPLASTY: SHX125

## 2013-03-14 LAB — TYPE AND SCREEN

## 2013-03-14 SURGERY — ARTHROPLASTY, KNEE, TOTAL
Anesthesia: Spinal | Site: Knee | Laterality: Left

## 2013-03-14 MED ORDER — BUPIVACAINE-EPINEPHRINE PF 0.25-1:200000 % IJ SOLN
INTRAMUSCULAR | Status: AC
  Filled 2013-03-14: qty 30

## 2013-03-14 MED ORDER — FLEET ENEMA 7-19 GM/118ML RE ENEM: 1.0000 | ENEMA | Freq: Once | RECTAL | Status: AC | PRN

## 2013-03-14 MED ORDER — FENTANYL CITRATE 0.05 MG/ML IJ SOLN
INTRAMUSCULAR | Status: AC
  Filled 2013-03-14: qty 2

## 2013-03-14 MED ORDER — ONDANSETRON HCL 4 MG PO TABS: 4.0000 mg | ORAL_TABLET | Freq: Four times a day (QID) | ORAL | Status: DC | PRN

## 2013-03-14 MED ORDER — PROPOFOL INFUSION 10 MG/ML OPTIME
INTRAVENOUS | Status: DC | PRN
  Administered 2013-03-14: 100 ug/kg/min via INTRAVENOUS

## 2013-03-14 MED ORDER — LACTATED RINGERS IV SOLN
INTRAVENOUS | Status: DC | PRN
  Administered 2013-03-14 (×2): via INTRAVENOUS

## 2013-03-14 MED ORDER — ATORVASTATIN CALCIUM 20 MG PO TABS
20.0000 mg | ORAL_TABLET | Freq: Every day | ORAL | Status: DC
  Administered 2013-03-14 – 2013-03-16 (×3): 20 mg via ORAL
  Filled 2013-03-14 (×4): qty 1

## 2013-03-14 MED ORDER — SODIUM CHLORIDE 0.9 % IV SOLN
INTRAVENOUS | Status: DC
  Administered 2013-03-14: 19:00:00 via INTRAVENOUS
  Filled 2013-03-14 (×8): qty 1000

## 2013-03-14 MED ORDER — OXYCODONE HCL 5 MG/5ML PO SOLN
5.0000 mg | Freq: Once | ORAL | Status: DC | PRN
  Filled 2013-03-14: qty 5

## 2013-03-14 MED ORDER — ALUM & MAG HYDROXIDE-SIMETH 200-200-20 MG/5ML PO SUSP: 30.0000 mL | ORAL | Status: DC | PRN

## 2013-03-14 MED ORDER — CELECOXIB 200 MG PO CAPS
200.0000 mg | ORAL_CAPSULE | Freq: Two times a day (BID) | ORAL | Status: DC
  Administered 2013-03-14 – 2013-03-17 (×6): 200 mg via ORAL
  Filled 2013-03-14 (×7): qty 1

## 2013-03-14 MED ORDER — CHLORHEXIDINE GLUCONATE 4 % EX LIQD: 60.0000 mL | Freq: Once | CUTANEOUS | Status: DC

## 2013-03-14 MED ORDER — BUPIVACAINE-EPINEPHRINE (PF) 0.25% -1:200000 IJ SOLN
INTRAMUSCULAR | Status: DC | PRN
  Administered 2013-03-14: 25 mL

## 2013-03-14 MED ORDER — METOCLOPRAMIDE HCL 10 MG PO TABS: 5.0000 mg | ORAL_TABLET | Freq: Three times a day (TID) | ORAL | Status: DC | PRN

## 2013-03-14 MED ORDER — SODIUM CHLORIDE 0.9 % IR SOLN
Status: DC | PRN
  Administered 2013-03-14: 1000 mL

## 2013-03-14 MED ORDER — METHOCARBAMOL 500 MG PO TABS
500.0000 mg | ORAL_TABLET | Freq: Four times a day (QID) | ORAL | Status: DC | PRN
  Administered 2013-03-15 – 2013-03-17 (×7): 500 mg via ORAL
  Filled 2013-03-14 (×8): qty 1

## 2013-03-14 MED ORDER — OXYCODONE HCL 5 MG PO TABS: 5.0000 mg | ORAL_TABLET | Freq: Once | ORAL | Status: DC | PRN

## 2013-03-14 MED ORDER — HYDROMORPHONE HCL PF 1 MG/ML IJ SOLN: 0.2500 mg | INTRAMUSCULAR | Status: DC | PRN

## 2013-03-14 MED ORDER — DEXAMETHASONE SODIUM PHOSPHATE 10 MG/ML IJ SOLN
10.0000 mg | Freq: Once | INTRAMUSCULAR | Status: AC
  Administered 2013-03-14: 10 mg via INTRAVENOUS

## 2013-03-14 MED ORDER — ZOLPIDEM TARTRATE 5 MG PO TABS: 5.0000 mg | ORAL_TABLET | Freq: Every evening | ORAL | Status: DC | PRN

## 2013-03-14 MED ORDER — MIDAZOLAM HCL 5 MG/5ML IJ SOLN
INTRAMUSCULAR | Status: DC | PRN
  Administered 2013-03-14: 2 mg via INTRAVENOUS

## 2013-03-14 MED ORDER — ASPIRIN EC 325 MG PO TBEC
325.0000 mg | DELAYED_RELEASE_TABLET | Freq: Two times a day (BID) | ORAL | Status: DC
  Administered 2013-03-15 – 2013-03-17 (×5): 325 mg via ORAL
  Filled 2013-03-14 (×7): qty 1

## 2013-03-14 MED ORDER — PHENOL 1.4 % MT LIQD
1.0000 | OROMUCOSAL | Status: DC | PRN
  Filled 2013-03-14: qty 177

## 2013-03-14 MED ORDER — PROPOFOL 10 MG/ML IV BOLUS
INTRAVENOUS | Status: AC
  Filled 2013-03-14: qty 20

## 2013-03-14 MED ORDER — DOCUSATE SODIUM 100 MG PO CAPS
100.0000 mg | ORAL_CAPSULE | Freq: Two times a day (BID) | ORAL | Status: DC
  Administered 2013-03-14 – 2013-03-17 (×6): 100 mg via ORAL

## 2013-03-14 MED ORDER — METHOCARBAMOL 100 MG/ML IJ SOLN
500.0000 mg | Freq: Four times a day (QID) | INTRAVENOUS | Status: DC | PRN
  Administered 2013-03-14: 18:00:00 500 mg via INTRAVENOUS
  Filled 2013-03-14: qty 5

## 2013-03-14 MED ORDER — HYDROMORPHONE HCL PF 1 MG/ML IJ SOLN
0.5000 mg | INTRAMUSCULAR | Status: DC | PRN
  Administered 2013-03-14: 0.5 mg via INTRAVENOUS
  Filled 2013-03-14: qty 1

## 2013-03-14 MED ORDER — BUPIVACAINE HCL (PF) 0.75 % IJ SOLN
INTRAMUSCULAR | Status: DC | PRN
  Administered 2013-03-14: 2 mL via INTRATHECAL

## 2013-03-14 MED ORDER — DEXAMETHASONE SODIUM PHOSPHATE 10 MG/ML IJ SOLN
10.0000 mg | Freq: Once | INTRAMUSCULAR | Status: AC
  Administered 2013-03-15: 14:00:00 10 mg via INTRAVENOUS
  Filled 2013-03-14: qty 1

## 2013-03-14 MED ORDER — METOCLOPRAMIDE HCL 5 MG/ML IJ SOLN: 5.0000 mg | Freq: Three times a day (TID) | INTRAMUSCULAR | Status: DC | PRN

## 2013-03-14 MED ORDER — SODIUM CHLORIDE 0.9 % IJ SOLN
INTRAMUSCULAR | Status: DC | PRN
  Administered 2013-03-14: 14:00:00

## 2013-03-14 MED ORDER — HYDROCODONE-ACETAMINOPHEN 7.5-325 MG PO TABS
1.0000 | ORAL_TABLET | ORAL | Status: DC
  Administered 2013-03-14: 22:00:00 2 via ORAL
  Administered 2013-03-14: 1 via ORAL
  Administered 2013-03-15 – 2013-03-17 (×14): 2 via ORAL
  Filled 2013-03-14: qty 1
  Filled 2013-03-14 (×15): qty 2

## 2013-03-14 MED ORDER — MEPERIDINE HCL 50 MG/ML IJ SOLN: 6.2500 mg | INTRAMUSCULAR | Status: DC | PRN

## 2013-03-14 MED ORDER — KETOROLAC TROMETHAMINE 30 MG/ML IJ SOLN
INTRAMUSCULAR | Status: DC | PRN
  Administered 2013-03-14: 30 mg via INTRAVENOUS

## 2013-03-14 MED ORDER — CLINDAMYCIN PHOSPHATE 600 MG/50ML IV SOLN
600.0000 mg | Freq: Four times a day (QID) | INTRAVENOUS | Status: AC
  Administered 2013-03-14 – 2013-03-15 (×2): 600 mg via INTRAVENOUS
  Filled 2013-03-14 (×2): qty 50

## 2013-03-14 MED ORDER — PHENYLEPHRINE 40 MCG/ML (10ML) SYRINGE FOR IV PUSH (FOR BLOOD PRESSURE SUPPORT)
PREFILLED_SYRINGE | INTRAVENOUS | Status: AC
  Filled 2013-03-14: qty 10

## 2013-03-14 MED ORDER — CLINDAMYCIN PHOSPHATE 900 MG/50ML IV SOLN
INTRAVENOUS | Status: AC
  Filled 2013-03-14: qty 50

## 2013-03-14 MED ORDER — FENTANYL CITRATE 0.05 MG/ML IJ SOLN
INTRAMUSCULAR | Status: DC | PRN
  Administered 2013-03-14: 100 ug via INTRAVENOUS

## 2013-03-14 MED ORDER — PROMETHAZINE HCL 25 MG/ML IJ SOLN: 6.2500 mg | INTRAMUSCULAR | Status: DC | PRN

## 2013-03-14 MED ORDER — POLYETHYLENE GLYCOL 3350 17 G PO PACK
17.0000 g | PACK | Freq: Two times a day (BID) | ORAL | Status: DC
  Administered 2013-03-14 – 2013-03-17 (×6): 17 g via ORAL

## 2013-03-14 MED ORDER — DIPHENHYDRAMINE HCL 25 MG PO CAPS: 25.0000 mg | ORAL_CAPSULE | Freq: Four times a day (QID) | ORAL | Status: DC | PRN

## 2013-03-14 MED ORDER — MENTHOL 3 MG MT LOZG
1.0000 | LOZENGE | OROMUCOSAL | Status: DC | PRN
  Filled 2013-03-14: qty 9

## 2013-03-14 MED ORDER — BUPIVACAINE LIPOSOME 1.3 % IJ SUSP
20.0000 mL | Freq: Once | INTRAMUSCULAR | Status: DC
  Filled 2013-03-14: qty 20

## 2013-03-14 MED ORDER — TRANEXAMIC ACID 100 MG/ML IV SOLN
1000.0000 mg | Freq: Once | INTRAVENOUS | Status: AC
  Administered 2013-03-14: 1000 mg via INTRAVENOUS
  Filled 2013-03-14: qty 10

## 2013-03-14 MED ORDER — FERROUS SULFATE 325 (65 FE) MG PO TABS
325.0000 mg | ORAL_TABLET | Freq: Three times a day (TID) | ORAL | Status: DC
  Administered 2013-03-14 – 2013-03-17 (×8): 325 mg via ORAL
  Filled 2013-03-14 (×11): qty 1

## 2013-03-14 MED ORDER — PHENYLEPHRINE HCL 10 MG/ML IJ SOLN
INTRAMUSCULAR | Status: DC | PRN
  Administered 2013-03-14 (×2): 80 ug via INTRAVENOUS

## 2013-03-14 MED ORDER — BISACODYL 10 MG RE SUPP
10.0000 mg | Freq: Every day | RECTAL | Status: DC | PRN
  Administered 2013-03-16: 16:00:00 10 mg via RECTAL
  Filled 2013-03-14: qty 1

## 2013-03-14 MED ORDER — KETOROLAC TROMETHAMINE 30 MG/ML IJ SOLN
INTRAMUSCULAR | Status: AC
  Filled 2013-03-14: qty 1

## 2013-03-14 MED ORDER — LEVOTHYROXINE SODIUM 125 MCG PO TABS
125.0000 ug | ORAL_TABLET | ORAL | Status: DC
  Administered 2013-03-15 – 2013-03-17 (×3): 125 ug via ORAL
  Filled 2013-03-14 (×4): qty 1

## 2013-03-14 MED ORDER — 0.9 % SODIUM CHLORIDE (POUR BTL) OPTIME
TOPICAL | Status: DC | PRN
  Administered 2013-03-14: 1000 mL

## 2013-03-14 MED ORDER — MIDAZOLAM HCL 2 MG/2ML IJ SOLN
INTRAMUSCULAR | Status: AC
  Filled 2013-03-14: qty 2

## 2013-03-14 MED ORDER — CLINDAMYCIN PHOSPHATE 900 MG/50ML IV SOLN
900.0000 mg | INTRAVENOUS | Status: AC
  Administered 2013-03-14: 900 mg via INTRAVENOUS

## 2013-03-14 MED ORDER — SODIUM CHLORIDE 0.9 % IJ SOLN
INTRAMUSCULAR | Status: AC
  Filled 2013-03-14: qty 20

## 2013-03-14 MED ORDER — ONDANSETRON HCL 4 MG/2ML IJ SOLN: 4.0000 mg | Freq: Four times a day (QID) | INTRAMUSCULAR | Status: DC | PRN

## 2013-03-14 SURGICAL SUPPLY — 67 items
ADH SKN CLS APL DERMABOND .7 (GAUZE/BANDAGES/DRESSINGS) ×1
BAG SPEC THK2 15X12 ZIP CLS (MISCELLANEOUS) ×1
BAG ZIPLOCK 12X15 (MISCELLANEOUS) ×2 IMPLANT
BANDAGE ELASTIC 6 VELCRO ST LF (GAUZE/BANDAGES/DRESSINGS) ×2 IMPLANT
BANDAGE ESMARK 6X9 LF (GAUZE/BANDAGES/DRESSINGS) ×1 IMPLANT
BLADE SAW SGTL 13.0X1.19X90.0M (BLADE) ×2 IMPLANT
BNDG CMPR 9X6 STRL LF SNTH (GAUZE/BANDAGES/DRESSINGS) ×1
BNDG ESMARK 6X9 LF (GAUZE/BANDAGES/DRESSINGS) ×2
BOWL SMART MIX CTS (DISPOSABLE) ×2 IMPLANT
CAPT RP KNEE ×1 IMPLANT
CEMENT HV SMART SET (Cement) ×2 IMPLANT
CUFF TOURN SGL QUICK 34 (TOURNIQUET CUFF) ×2
CUFF TRNQT CYL 34X4X40X1 (TOURNIQUET CUFF) ×1 IMPLANT
DECANTER SPIKE VIAL GLASS SM (MISCELLANEOUS) ×1 IMPLANT
DERMABOND ADVANCED (GAUZE/BANDAGES/DRESSINGS) ×1
DERMABOND ADVANCED .7 DNX12 (GAUZE/BANDAGES/DRESSINGS) ×1 IMPLANT
DRAPE EXTREMITY T 121X128X90 (DRAPE) ×2 IMPLANT
DRAPE POUCH INSTRU U-SHP 10X18 (DRAPES) ×2 IMPLANT
DRAPE U-SHAPE 47X51 STRL (DRAPES) ×2 IMPLANT
DRSG AQUACEL AG ADV 3.5X10 (GAUZE/BANDAGES/DRESSINGS) ×2 IMPLANT
DRSG TEGADERM 4X4.75 (GAUZE/BANDAGES/DRESSINGS) IMPLANT
DURAPREP 26ML APPLICATOR (WOUND CARE) ×4 IMPLANT
ELECT REM PT RETURN 9FT ADLT (ELECTROSURGICAL) ×2
ELECTRODE REM PT RTRN 9FT ADLT (ELECTROSURGICAL) ×1 IMPLANT
EVACUATOR 1/8 PVC DRAIN (DRAIN) IMPLANT
FACESHIELD LNG OPTICON STERILE (SAFETY) ×10 IMPLANT
GAUZE SPONGE 2X2 8PLY STRL LF (GAUZE/BANDAGES/DRESSINGS) IMPLANT
GLOVE BIOGEL PI IND STRL 6.5 (GLOVE) IMPLANT
GLOVE BIOGEL PI IND STRL 7.0 (GLOVE) IMPLANT
GLOVE BIOGEL PI IND STRL 7.5 (GLOVE) ×1 IMPLANT
GLOVE BIOGEL PI IND STRL 8 (GLOVE) ×1 IMPLANT
GLOVE BIOGEL PI IND STRL 8.5 (GLOVE) IMPLANT
GLOVE BIOGEL PI INDICATOR 6.5 (GLOVE) ×1
GLOVE BIOGEL PI INDICATOR 7.0 (GLOVE) ×1
GLOVE BIOGEL PI INDICATOR 7.5 (GLOVE) ×2
GLOVE BIOGEL PI INDICATOR 8 (GLOVE) ×2
GLOVE BIOGEL PI INDICATOR 8.5 (GLOVE) ×1
GLOVE ECLIPSE 8.0 STRL XLNG CF (GLOVE) ×2 IMPLANT
GLOVE ORTHO TXT STRL SZ7.5 (GLOVE) ×4 IMPLANT
GOWN BRE IMP PREV XXLGXLNG (GOWN DISPOSABLE) ×4 IMPLANT
GOWN PREVENTION PLUS LG XLONG (DISPOSABLE) ×2 IMPLANT
HANDPIECE INTERPULSE COAX TIP (DISPOSABLE) ×2
KIT BASIN OR (CUSTOM PROCEDURE TRAY) ×2 IMPLANT
MANIFOLD NEPTUNE II (INSTRUMENTS) ×2 IMPLANT
NDL HYPO 21X1.5 SAFETY (NEEDLE) IMPLANT
NDL SAFETY ECLIPSE 18X1.5 (NEEDLE) ×1 IMPLANT
NEEDLE HYPO 18GX1.5 SHARP (NEEDLE) ×4
NEEDLE HYPO 21X1.5 SAFETY (NEEDLE) ×2 IMPLANT
NS IRRIG 1000ML POUR BTL (IV SOLUTION) ×2 IMPLANT
PACK TOTAL JOINT (CUSTOM PROCEDURE TRAY) ×2 IMPLANT
POSITIONER SURGICAL ARM (MISCELLANEOUS) ×2 IMPLANT
SET HNDPC FAN SPRY TIP SCT (DISPOSABLE) ×1 IMPLANT
SET PAD KNEE POSITIONER (MISCELLANEOUS) ×2 IMPLANT
SPONGE GAUZE 2X2 STER 10/PKG (GAUZE/BANDAGES/DRESSINGS)
SUCTION FRAZIER 12FR DISP (SUCTIONS) ×2 IMPLANT
SUT MNCRL AB 4-0 PS2 18 (SUTURE) ×2 IMPLANT
SUT VIC AB 1 CT1 36 (SUTURE) ×2 IMPLANT
SUT VIC AB 2-0 CT1 27 (SUTURE) ×6
SUT VIC AB 2-0 CT1 TAPERPNT 27 (SUTURE) ×3 IMPLANT
SUT VLOC 180 0 24IN GS25 (SUTURE) ×2 IMPLANT
SYR 20CC LL (SYRINGE) ×1 IMPLANT
SYR 3ML LL SCALE MARK (SYRINGE) ×1 IMPLANT
SYR 50ML LL SCALE MARK (SYRINGE) ×2 IMPLANT
TOWEL OR 17X26 10 PK STRL BLUE (TOWEL DISPOSABLE) ×4 IMPLANT
TRAY FOLEY CATH 14FRSI W/METER (CATHETERS) IMPLANT
WATER STERILE IRR 1500ML POUR (IV SOLUTION) ×2 IMPLANT
WRAP KNEE MAXI GEL POST OP (GAUZE/BANDAGES/DRESSINGS) ×2 IMPLANT

## 2013-03-14 NOTE — Plan of Care (Signed)
Problem: Consults Goal: Diagnosis- Total Joint Replacement Primary Total Knee     

## 2013-03-14 NOTE — Anesthesia Procedure Notes (Addendum)
Spinal  Start time: 03/14/2013 1:08 PM Preanesthetic Checklist Completed: patient identified, site marked, surgical consent, pre-op evaluation, timeout performed, IV checked, risks and benefits discussed and monitors and equipment checked Spinal Block Patient position: sitting Prep: Betadine and site prepped and draped Patient monitoring: heart rate, continuous pulse ox and blood pressure Approach: midline Location: L3-4 Injection technique: single-shot Needle Needle type: Sprotte  Needle gauge: 24 G Assessment Sensory level: T6  Spinal   Performed by Kathie Rhodes [place], Dr. Renold Don present

## 2013-03-14 NOTE — Progress Notes (Signed)
Dr. Renold Don, anesthes in to see pt ; spinal level L2 with minimal movement to upper legs; VS stable; OK for pt to go to floor

## 2013-03-14 NOTE — Op Note (Signed)
NAME:  Jasmine Donovan                      MEDICAL RECORD NO.:  [id number]                             FACILITY:  [facility]      PHYSICIAN:  Madlyn Frankel. Charlann Boxer, M.D.  DATE OF BIRTH:  1951-01-22      DATE OF PROCEDURE:  03/14/2013                                     OPERATIVE REPORT         PREOPERATIVE DIAGNOSIS:  Left knee osteoarthritis.      POSTOPERATIVE DIAGNOSIS:  Left knee osteoarthritis. Status post right total knee replacement     FINDINGS:  The patient was noted to have complete loss of cartilage and   bone-on-bone arthritis with associated osteophytes in all 3 compartments of   the knee with a significant synovitis and associated effusion.      PROCEDURE:  Left total knee replacement.      COMPONENTS USED:  DePuy rotating platform posterior stabilized knee   system, a size 3 femur, 3 tibia, 10 mm PS insert, and 38 patellar   button.      SURGEON:  Madlyn Frankel. Charlann Boxer, M.D.      ASSISTANT:  Lanney Gins, PA-C.      ANESTHESIA:  Spinal.      SPECIMENS:  None.      COMPLICATION:  None.      DRAINS:  None.  EBL: <100cc      TOURNIQUET TIME:   Total Tourniquet Time Documented: Thigh (Left) - 31 minutes Total: Thigh (Left) - 31 minutes  .      The patient was stable to the recovery room.      INDICATION FOR PROCEDURE:  Jasmine Donovan is a 62 y.o. female patient of   mine.  The patient had been seen, evaluated, and treated conservatively in the   office with medication, activity modification, and injections.  The patient had   radiographic changes of bone-on-bone arthritis with endplate sclerosis and osteophytes noted.      The patient failed conservative measures including medication, injections, and activity modification, and at this point was ready for more definitive measures.   Based on the radiographic changes and failed conservative measures, the patient   decided to proceed with total knee replacement.  Risks of infection,   DVT, component failure, need for  revision surgery, postop course, and   expectations were all   discussed and reviewed.  Consent was obtained for benefit of pain   relief.      PROCEDURE IN DETAIL:  The patient was brought to the operative theater.   Once adequate anesthesia, preoperative antibiotics, 2 gm of Ancef administered, the patient was positioned supine with the left thigh tourniquet placed.  The  left lower extremity was prepped and draped in sterile fashion.  A time-   out was performed identifying the patient, planned procedure, and   extremity.      The left lower extremity was placed in the [facility] leg holder.  The leg was   exsanguinated, tourniquet elevated to 250 mmHg.  A midline incision was   made followed by median parapatellar arthrotomy.  Following initial   exposure, attention was  first directed to the patella.  Precut   measurement was noted to be 23 mm.  I resected down to 14-15 mm and used a   38 patellar button to restore patellar height as well as cover the cut   surface.      The lug holes were drilled and a metal shim was placed to protect the   patella from retractors and saw blades.      At this point, attention was now directed to the femur.  The femoral   canal was opened with a drill, irrigated to try to prevent fat emboli.  An   intramedullary rod was passed at 5 degrees valgus, 10 mm of bone was   resected off the distal femur.  Following this resection, the tibia was   subluxated anteriorly.  Using the extramedullary guide, 10 mm of bone was resected off   the proximal lateral tibia.  We confirmed the gap would be   stable medially and laterally with a 10 mm insert as well as confirmed   the cut was perpendicular in the coronal plane, checking with an alignment rod.      Once this was done, I sized the femur to be a size 3 in the anterior-   posterior dimension, chose a standard component based on medial and   lateral dimension.  The size 3 rotation block was then pinned in    position anterior referenced using the C-clamp to set rotation.  The   anterior, posterior, and  chamfer cuts were made without difficulty nor   notching making certain that I was along the anterior cortex to help   with flexion gap stability.      The final box cut was made off the lateral aspect of distal femur.      At this point, the tibia was sized to be a size 3, the size 3 tray was   then pinned in position through the medial third of the tubercle,   drilled, and keel punched.  Trial reduction was now carried with a 3 femur,  3 tibia, a 10 mm insert, and the 38 patella botton.  The knee was brought to   extension, full extension with good flexion stability with the patella   tracking through the trochlea without application of pressure.  Given   all these findings, the trial components removed.  Final components were   opened and cement was mixed.  The knee was irrigated with normal saline   solution and pulse lavage.  The synovial lining was   then injected with 0.25% Marcaine with epinephrine and 1 cc of Toradol,   total of 61 cc.      The knee was irrigated.  Final implants were then cemented onto clean and   dried cut surfaces of bone with the knee brought to extension with a 10 mm trial insert.      Once the cement had fully cured, the excess cement was removed   throughout the knee.  I confirmed I was satisfied with the range of   motion and stability, and the final 10 mm PS insert was chosen.  It was   placed into the knee.      The tourniquet had been let down at 30 minutes.  No significant   hemostasis required.  The   extensor mechanism was then reapproximated using #1 Vicryl and #0 V-lock sutures with the knee   in flexion.  The   remaining wound was  closed with 2-0 Vicryl and running 4-0 Monocryl.   The knee was cleaned, dried, dressed sterilely using Dermabond and   Aquacel dressing.  The patient was then   brought to recovery room in stable condition, tolerating  the procedure   well.   Please note that Physician Assistant, Lanney Gins, was present for the entirety of the case, and was utilized for pre-operative positioning, peri-operative retractor management, general facilitation of the procedure.  He was also utilized for primary wound closure at the end of the case.              Madlyn Frankel Charlann Boxer, M.D.    03/14/2013 2:25 PM

## 2013-03-14 NOTE — Interval H&P Note (Signed)
History and Physical Interval Note:  03/14/2013 12:01 PM  Jasmine Donovan  has presented today for surgery, with the diagnosis of LEFT KNEE OA   The various methods of treatment have been discussed with the patient and family. After consideration of risks, benefits and other options for treatment, the patient has consented to  Procedure(s): LEFT TOTAL KNEE ARTHROPLASTY (Left) as a surgical intervention .  The patient's history has been reviewed, patient examined, no change in status, stable for surgery.  I have reviewed the patient's chart and labs.  Questions were answered to the patient's satisfaction.     Shelda Pal

## 2013-03-14 NOTE — Anesthesia Preprocedure Evaluation (Addendum)
Anesthesia Evaluation  Patient identified by MRN, date of birth, ID band Patient awake    Reviewed: Allergy & Precautions, H&P , NPO status , Patient's Chart, lab work & pertinent test results  Airway Mallampati: II TM Distance: >3 FB Neck ROM: Full    Dental no notable dental hx. (+) Dental Advisory Given   Pulmonary neg pulmonary ROS,  CXR 07-20-12 normal. breath sounds clear to auscultation  Pulmonary exam normal       Cardiovascular + Peripheral Vascular Disease negative cardio ROS  Rhythm:Regular Rate:Normal     Neuro/Psych  Headaches, negative psych ROS   GI/Hepatic Neg liver ROS, GERD-  Medicated,  Endo/Other  Hypothyroidism   Renal/GU negative Renal ROS     Musculoskeletal negative musculoskeletal ROS (+)   Abdominal (+) + obese,   Peds  Hematology  (+) anemia ,   Anesthesia Other Findings   Reproductive/Obstetrics negative OB ROS                           Anesthesia Physical  Anesthesia Plan  ASA: II  Anesthesia Plan: Spinal   Post-op Pain Management:    Induction:   Airway Management Planned:   Additional Equipment:   Intra-op Plan:   Post-operative Plan:   Informed Consent: I have reviewed the patients History and Physical, chart, labs and discussed the procedure including the risks, benefits and alternatives for the proposed anesthesia with the patient or authorized representative who has indicated his/her understanding and acceptance.   Dental advisory given  Plan Discussed with: CRNA  Anesthesia Plan Comments: (Discussed risks/benefits of spinal including headache, backache, failure, bleeding, infection, and nerve damage. Patient consents to spinal. Questions answered. Coagulation studies and platelet count acceptable.)        Anesthesia Quick Evaluation

## 2013-03-14 NOTE — Anesthesia Postprocedure Evaluation (Signed)
Anesthesia Post Note  Patient: Jasmine Donovan  Procedure(s) Performed: Procedure(s) (LRB): LEFT TOTAL KNEE ARTHROPLASTY (Left)  Anesthesia type: Spinal  Patient location: PACU  Post pain: Pain level controlled  Post assessment: Post-op Vital signs reviewed  Last Vitals: BP 138/77  Pulse 80  Temp(Src) 36.7 C (Oral)  Resp 16  SpO2 99%  Post vital signs: Reviewed  Level of consciousness: sedated  Complications: No apparent anesthesia complications

## 2013-03-14 NOTE — Transfer of Care (Signed)
Immediate Anesthesia Transfer of Care Note  Patient: Jasmine Donovan  Procedure(s) Performed: Procedure(s): LEFT TOTAL KNEE ARTHROPLASTY (Left)  Patient Location: PACU  Anesthesia Type:Spinal  Level of Consciousness: awake and alert   Airway & Oxygen Therapy: Patient Spontanous Breathing and Patient connected to face mask oxygen  Post-op Assessment: Report given to PACU RN and Post -op Vital signs reviewed and stable  Post vital signs: Reviewed and stable  Complications: No apparent anesthesia complications

## 2013-03-15 LAB — CBC
HCT: 30.3 % — ABNORMAL LOW (ref 36.0–46.0)
MCH: 29.1 pg (ref 26.0–34.0)
Platelets: 223 10*3/uL (ref 150–400)
RDW: 13.7 % (ref 11.5–15.5)
WBC: 17.3 10*3/uL — ABNORMAL HIGH (ref 4.0–10.5)

## 2013-03-15 LAB — BASIC METABOLIC PANEL
BUN: 11 mg/dL (ref 6–23)
Calcium: 8.8 mg/dL (ref 8.4–10.5)
Chloride: 102 mEq/L (ref 96–112)
Creatinine, Ser: 0.65 mg/dL (ref 0.50–1.10)
GFR calc Af Amer: >90 mL/min (ref >90)
GFR calc non Af Amer: >90 mL/min (ref >90)
Potassium: 4.3 mEq/L (ref 3.5–5.1)

## 2013-03-15 NOTE — Progress Notes (Signed)
Clinical Social Work Department BRIEF PSYCHOSOCIAL ASSESSMENT 03/15/2013  Patient:  [facility]     Account Number:  [id number]     Admit date:  03/14/2013  Clinical Social Worker:  Candie Chroman  Date/Time:  03/15/2013 11:10 AM  Referred by:  Physician  Date Referred:  03/15/2013 Referred for  SNF Placement   Other Referral:   Interview type:  Patient Other interview type:    PSYCHOSOCIAL DATA Living Status:  HUSBAND Admitted from facility:   Level of care:   Primary support name:  [name] Primary support relationship to patient:  SPOUSE Degree of support available:   limited    CURRENT CONCERNS Current Concerns  Post-Acute Placement   Other Concerns:    SOCIAL WORK ASSESSMENT / PLAN Pt is a 62 yr old female living at home prior to hospitalization. CSW met with pt to assist with d/c planning. Pt plans to have ST Rehab at [facility] following hospital d/c. SNF contacted and d/c plan confirmed pending [employer] authorization. CSW will assist with insurance authorization as needed.   Assessment/plan status:  Psychosocial Support/Ongoing Assessment of Needs Other assessment/ plan:   Information/referral to community resources:   Insurance coverage for SNF reviewed.    PATIENT'S/FAMILY'S RESPONSE TO PLAN OF CARE: " I've been to [facility] for rehab in the past and I liked it there. I'd like to have my rehab there again. "   [name] LCSW [phone]

## 2013-03-15 NOTE — Progress Notes (Signed)
Physical Therapy Treatment Patient Details Name: Jasmine Donovan MRN: [id number] DOB: 11-02-1950 Today's Date: 03/15/2013 Time: 8295-6213 PT Time Calculation (min): 26 min  PT Assessment / Plan / Recommendation  History of Present Illness L TKR    PT Comments   Pt to/from bathroom and ambulating in halls.  Marked improvement in activity tolerance vs this am.  Follow Up Recommendations  SNF     Does the patient have the potential to tolerate intense rehabilitation     Barriers to Discharge        Equipment Recommendations  Rolling walker with 5" wheels    Recommendations for Other Services OT consult  Frequency 7X/week   Progress towards PT Goals Progress towards PT goals: Progressing toward goals  Plan Current plan remains appropriate    Precautions / Restrictions Precautions Precautions: Fall;Knee Restrictions Weight Bearing Restrictions: No Other Position/Activity Restrictions: WBAT   Pertinent Vitals/Pain 7/10; premed, MR requested, ice packs provided    Mobility  Bed Mobility Bed Mobility: Sit to Supine Sit to Supine: 4: Min assist Details for Bed Mobility Assistance: min cues for sequence and use of R LE to self assist Transfers Transfers: Sit to Stand;Stand to Sit Sit to Stand: 4: Min assist;With upper extremity assist;From chair/3-in-1 Stand to Sit: 4: Min assist;To chair/3-in-1;With upper extremity assist;To bed Details for Transfer Assistance: verbal cues for hand placement and LE management Ambulation/Gait Ambulation/Gait Assistance: 4: Min assist Ambulation Distance (Feet): 148 Feet (and 25) Assistive device: Rolling walker Ambulation/Gait Assistance Details: cues for sequence, posture and position from RW Gait Pattern: Step-to pattern;Decreased step length - right;Decreased step length - left;Shuffle;Antalgic;Trunk flexed Stairs: No    Exercises     PT Diagnosis:    PT Problem List:   PT Treatment Interventions:     PT Goals (current goals can  now be found in the care plan section) Acute Rehab PT Goals Patient Stated Goal: return home PT Goal Formulation: With patient Time For Goal Achievement: 03/23/13 Potential to Achieve Goals: Good  Visit Information  Last PT Received On: 03/15/13 Assistance Needed: +1 History of Present Illness: L TKR     Subjective Data  Subjective: Better than this morning Patient Stated Goal: return home   Cognition  Cognition Arousal/Alertness: Awake/alert Behavior During Therapy: WFL for tasks assessed/performed Overall Cognitive Status: Within Functional Limits for tasks assessed    Balance     End of Session PT - End of Session Equipment Utilized During Treatment: Gait belt Activity Tolerance: Patient tolerated treatment well Patient left: in bed;with call bell/phone within reach Nurse Communication: Mobility status   GP     Jasmine Donovan 03/15/2013, 3:10 PM

## 2013-03-15 NOTE — Progress Notes (Signed)
Utilization review completed.  

## 2013-03-15 NOTE — Evaluation (Signed)
Occupational Therapy Evaluation Patient Details Name: Jasmine Donovan MRN: [id number] DOB: [birth year] Today's Date: 03/15/2013 Time: 2130-8657 OT Time Calculation (min): 24 min  OT Assessment / Plan / Recommendation History of present illness L TKR    Clinical Impression   Pt limited by pain 9/10 with activity but still doing well. She will benefit from skilled OT services to maximize independence for d/c home with husband who can only provide limited assist.    OT Assessment  Patient needs continued OT Services    Follow Up Recommendations  SNF;Supervision/Assistance - 24 hour    Barriers to Discharge      Equipment Recommendations  3 in 1 bedside comode    Recommendations for Other Services    Frequency  Min 2X/week    Precautions / Restrictions Precautions Precautions: Fall;Knee Restrictions Weight Bearing Restrictions: No Other Position/Activity Restrictions: WBAT   Pertinent Vitals/Pain 9/10 at rest and with activity; reposition, rest; ice; L knee    ADL  Eating/Feeding: Independent Where Assessed - Eating/Feeding: Chair Grooming: Wash/dry hands;Set up;Teeth care Where Assessed - Grooming: Supported sitting Upper Body Bathing: Chest;Right arm;Left arm;Abdomen;Set up Where Assessed - Upper Body Bathing: Unsupported sitting Lower Body Bathing: Moderate assistance Where Assessed - Lower Body Bathing: Supported sit to stand Upper Body Dressing: Minimal assistance Where Assessed - Upper Body Dressing: Unsupported sitting Lower Body Dressing: Moderate assistance Where Assessed - Lower Body Dressing: Supported sit to Pharmacist, hospital: Minimal Web designer: Raised toilet seat with arms (or 3-in-1 over toilet) Toileting - Clothing Manipulation and Hygiene: Minimal assistance Where Assessed - Engineer, mining and Hygiene: Sit to stand from 3-in-1 or toilet Equipment Used: Rolling walker ADL Comments: Pt needed cues to keep RW  on the floor with transfer into bathroom to 3in1. Also cues for distance of walker to self and hand placement/LE management. She had one LOB trying to reach back for 3in1 needing min assist to recover balance.     OT Diagnosis: Generalized weakness  OT Problem List: Decreased strength;Decreased knowledge of use of DME or AE OT Treatment Interventions: Self-care/ADL training;DME and/or AE instruction;Therapeutic activities;Patient/family education   OT Goals(Current goals can be found in the care plan section) Acute Rehab OT Goals Patient Stated Goal: return home  Visit Information  Last OT Received On: 03/15/13 Assistance Needed: +1 History of Present Illness: L TKR        Prior Functioning     Home Living Family/patient expects to be discharged to:: Skilled nursing facility Living Arrangements: Spouse/significant other Prior Function Level of Independence: Independent Communication Communication: No difficulties         Vision/Perception     Cognition  Cognition Arousal/Alertness: Awake/alert Behavior During Therapy: WFL for tasks assessed/performed Overall Cognitive Status: Within Functional Limits for tasks assessed    UE assessment Grossly WFL  Mobility  Transfers Transfers: Sit to Stand;Stand to Sit Sit to Stand: 4: Min assist;With upper extremity assist;From chair/3-in-1 Stand to Sit: 4: Min assist;To chair/3-in-1;With upper extremity assist Details for Transfer Assistance: verbal cues for hand placement and LE management        Balance Balance Balance Assessed: Yes Dynamic Standing Balance Dynamic Standing - Level of Assistance: 4: Min assist   End of Session OT - End of Session Equipment Utilized During Treatment: Rolling walker Activity Tolerance: Patient limited by pain Patient left: in chair;with call bell/phone within reach;with family/visitor present  GO     Lennox Laity [phone] 03/15/2013, 10:57 AM

## 2013-03-15 NOTE — Progress Notes (Signed)
Clinical Social Work Department CLINICAL SOCIAL WORK PLACEMENT NOTE 03/15/2013  Patient:  [facility]  Account Number:  [id number] Admit date:  03/14/2013  Clinical Social Worker:  [name], LCSW  Date/time:  03/15/2013 11:25 AM  Clinical Social Work is seeking post-discharge placement for this patient at the following level of care:   SKILLED NURSING   (*CSW will update this form in Epic as items are completed)     Patient/family provided with [facility] Department of Clinical Social Work's list of facilities offering this level of care within the geographic area requested by the patient (or if unable, by the patient's family).  03/15/2013  Patient/family informed of their freedom to choose among providers that offer the needed level of care, that participate in Medicare, Medicaid or managed care program needed by the patient, have an available bed and are willing to accept the patient.    Patient/family informed of [facility]' ownership interest in [facility], as well as of the fact that they are under no obligation to receive care at this facility.  PASARR submitted to EDS on  PASARR number received from [employer] on 01/03/2013  FL2 transmitted to all facilities in geographic area requested by pt/family on  03/15/2013 FL2 transmitted to all facilities within larger geographic area on   Patient informed that his/her managed care company has contracts with or will negotiate with  certain facilities, including the following:     Patient/family informed of bed offers received:  03/15/2013 Patient chooses bed at [facility] Physician recommends and patient chooses bed at    Patient to be transferred to [facility] on   Patient to be transferred to facility by   The following physician request were entered in Epic:   Additional Comments:

## 2013-03-15 NOTE — Evaluation (Signed)
Physical Therapy Evaluation Patient Details Name: Jasmine Donovan MRN: [id number] DOB: [birth year] Today's Date: 03/15/2013 Time: 2956-2130 PT Time Calculation (min): 32 min  PT Assessment / Plan / Recommendation History of Present Illness  L TKR   Clinical Impression  Pt s/p L TKR presents with decreased L LE strength/ROM and post op pain limiting functional mobility.  Pt would benefit from follow up rehab at SNF level prior to d/c home with limited assist    PT Assessment  Patient needs continued PT services    Follow Up Recommendations  SNF    Does the patient have the potential to tolerate intense rehabilitation      Barriers to Discharge        Equipment Recommendations  Rolling walker with 5" wheels    Recommendations for Other Services OT consult   Frequency 7X/week    Precautions / Restrictions Precautions Precautions: Fall;Knee Restrictions Weight Bearing Restrictions: No Other Position/Activity Restrictions: WBAT   Pertinent Vitals/Pain 13/13; premed, ice packs provided      Mobility  Bed Mobility Bed Mobility: Supine to Sit Supine to Sit: 4: Min assist Details for Bed Mobility Assistance: min cues for sequence and use of R LE to self assist Transfers Transfers: Sit to Stand;Stand to Sit Sit to Stand: 4: Min assist;From bed;From chair/3-in-1;With upper extremity assist Stand to Sit: 4: Min assist;With upper extremity assist;To chair/3-in-1 Details for Transfer Assistance: cues for LE management and use of UEs to self assist Ambulation/Gait Ambulation/Gait Assistance: 4: Min assist Ambulation Distance (Feet): 23 Feet Assistive device: Rolling walker Ambulation/Gait Assistance Details: cues for sequence, posture, stride length, and increased UE WB Gait Pattern: Step-to pattern;Decreased step length - right;Decreased step length - left;Shuffle;Antalgic;Trunk flexed    Exercises Total Joint Exercises Ankle Circles/Pumps: AROM;Supine;Both;15 reps Quad  Sets: AROM;Both;10 reps;Supine Heel Slides: AAROM;10 reps;Supine;Left Straight Leg Raises: AROM;AAROM;15 reps;Left;Supine   PT Diagnosis: Difficulty walking  PT Problem List: Decreased strength;Decreased range of motion;Decreased activity tolerance;Decreased mobility;Decreased knowledge of use of DME;Pain;Obesity PT Treatment Interventions: DME instruction;Gait training;Stair training;Functional mobility training;Therapeutic activities;Therapeutic exercise;Patient/family education     PT Goals(Current goals can be found in the care plan section) Acute Rehab PT Goals Patient Stated Goal: Rehab and home with min assist PT Goal Formulation: With patient Time For Goal Achievement: 03/23/13 Potential to Achieve Goals: Good  Visit Information  Last PT Received On: 03/15/13 Assistance Needed: +1 History of Present Illness: L TKR        Prior Functioning  Home Living Family/patient expects to be discharged to:: Skilled nursing facility Prior Function Level of Independence: Independent Communication Communication: No difficulties    Cognition  Cognition Arousal/Alertness: Awake/alert Behavior During Therapy: WFL for tasks assessed/performed Overall Cognitive Status: Within Functional Limits for tasks assessed    Extremity/Trunk Assessment Upper Extremity Assessment Upper Extremity Assessment: Overall WFL for tasks assessed Lower Extremity Assessment Lower Extremity Assessment: RLE deficits/detail;LLE deficits/detail RLE Deficits / Details: 4/5 quads with AROM at knee -10 - 90 LLE Deficits / Details: 3-/5 quads with AAROM at knee -10 - 85 Cervical / Trunk Assessment Cervical / Trunk Assessment: Normal   Balance    End of Session PT - End of Session Equipment Utilized During Treatment: Gait belt Activity Tolerance: Patient tolerated treatment well Patient left: in chair;with call bell/phone within reach;with family/visitor present Nurse Communication: Mobility status  GP      Antoin Dargis 03/15/2013, 9:10 AM

## 2013-03-16 LAB — CBC
HCT: 29.1 % — ABNORMAL LOW (ref 36.0–46.0)
Hemoglobin: 9.5 g/dL — ABNORMAL LOW (ref 12.0–15.0)
Platelets: 227 10*3/uL (ref 150–400)
RDW: 13.9 % (ref 11.5–15.5)
WBC: 11.2 10*3/uL — ABNORMAL HIGH (ref 4.0–10.5)

## 2013-03-16 LAB — BASIC METABOLIC PANEL
BUN: 16 mg/dL (ref 6–23)
Chloride: 105 mEq/L (ref 96–112)
GFR calc Af Amer: >90 mL/min (ref >90)
Glucose, Bld: 117 mg/dL — ABNORMAL HIGH (ref 70–99)
Potassium: 3.8 mEq/L (ref 3.5–5.1)

## 2013-03-16 MED ORDER — HYDROCODONE-ACETAMINOPHEN 7.5-325 MG PO TABS: 1.0000 | ORAL_TABLET | ORAL | Status: DC | PRN

## 2013-03-16 MED ORDER — DSS 100 MG PO CAPS: 100.0000 mg | ORAL_CAPSULE | Freq: Two times a day (BID) | ORAL | Status: DC

## 2013-03-16 MED ORDER — ASPIRIN 325 MG PO TBEC: 325.0000 mg | DELAYED_RELEASE_TABLET | Freq: Two times a day (BID) | ORAL | Status: AC

## 2013-03-16 MED ORDER — POLYETHYLENE GLYCOL 3350 17 G PO PACK: 17.0000 g | PACK | Freq: Two times a day (BID) | ORAL | Status: AC

## 2013-03-16 MED ORDER — FERROUS SULFATE 325 (65 FE) MG PO TABS: 325.0000 mg | ORAL_TABLET | Freq: Three times a day (TID) | ORAL | Status: DC

## 2013-03-16 MED ORDER — METHOCARBAMOL 500 MG PO TABS: 500.0000 mg | ORAL_TABLET | Freq: Four times a day (QID) | ORAL | Status: DC | PRN

## 2013-03-16 NOTE — Progress Notes (Signed)
Physical Therapy Treatment Patient Details Name: Starsha Morning MRN: [id number] DOB: 1951/01/23 Today's Date: 03/16/2013 Time: 4098-1191 PT Time Calculation (min): 25 min  PT Assessment / Plan / Recommendation  History of Present Illness L TKR    PT Comments   POD # 2 pm session.  Assisted pt from recliner to amb to BR to void then limited amb distance in hallway due to MAX c/o fatigue and 7/10 knee pain.  Pt requires increased time and unsteady gait.  HIGH FALL RISK.  Pt will need ST Rehab at SNF prior to returning home.   Follow Up Recommendations  SNF     Does the patient have the potential to tolerate intense rehabilitation     Barriers to Discharge        Equipment Recommendations       Recommendations for Other Services    Frequency 7X/week   Progress towards PT Goals Progress towards PT goals: Progressing toward goals  Plan      Precautions / Restrictions Precautions Precautions: Fall;Knee Restrictions Weight Bearing Restrictions: No Other Position/Activity Restrictions: WBAT    Pertinent Vitals/Pain C/o 7/10 knee pain ICE applied   Mobility  Bed Mobility Bed Mobility: Sit to Supine Supine to Sit: 4: Min assist Sit to Supine: 4: Min assist Details for Bed Mobility Assistance: assist with L LE up onto bed plus increased time Transfers Transfers: Sit to Stand;Stand to Sit Sit to Stand: 4: Min assist;With upper extremity assist;From chair/3-in-1;From toilet Stand to Sit: 4: Min assist;With upper extremity assist;To bed;To toilet Details for Transfer Assistance: verbal cues for hand placement and LE management plus increased time Ambulation/Gait Ambulation/Gait Assistance: 4: Min guard;4: Min assist Ambulation Distance (Feet): 42 Feet Assistive device: Rolling walker Ambulation/Gait Assistance Details: Unsteady gait with Mod c/o knee pain and instability.  Gait Pattern: Step-to pattern;Decreased step length - right;Decreased step length -  left;Shuffle;Antalgic;Trunk flexed Gait velocity: decreased    PT Goals (current goals can now be found in the care plan section)    Visit Information  Last PT Received On: 03/16/13 Assistance Needed: +1 History of Present Illness: L TKR     Subjective Data      Cognition       Balance     End of Session PT - End of Session Equipment Utilized During Treatment: Gait belt Activity Tolerance: Patient limited by pain Patient left: in bed;with call bell/phone within reach;with family/visitor present   Felecia Shelling  PTA [facility]  Acute  Rehab Pager      [phone]

## 2013-03-16 NOTE — Progress Notes (Signed)
   Subjective: 1 Days Post-Op Procedure(s) (LRB): LEFT TOTAL KNEE ARTHROPLASTY (Left)   Patient reports pain as mild, pain controlled. No events throughout the night.   Objective:   VITALS:   Filed Vitals:   03/15/13   BP: 154/77  Pulse: 70  Temp: 97.8 F (36.6 C)  Resp: 16    Neurovascular intact Dorsiflexion/Plantar flexion intact Incision: dressing C/D/I No cellulitis present Compartment soft  LABS  Recent Labs  03/15/13 0447  HGB 10.0*  HCT 30.3*  WBC 17.3*  PLT 223     Recent Labs  03/15/13 0447  NA 135  K 4.3  BUN 11  CREATININE 0.65  GLUCOSE 129*     Assessment/Plan: 1 Days Post-Op Procedure(s) (LRB): LEFT TOTAL KNEE ARTHROPLASTY (Left) Advance diet Up with therapy D/C IV fluids Discharge to SNF eventually, when ready  Expected ABLA  Treated with iron and will observe   Obese (BMI 30-39.9)  Estimated body mass index is 30.1 kg/(m^2) as calculated from the following:      Height as of this encounter: 5\' 6"  (1.676 m).      Weight as of this encounter: 84.55 kg (186 lb 6.4 oz).  Patient also counseled that weight may inhibit the healing process  Patient counseled that losing weight will help with future health issues       Jasmine Donovan. Jasmine Donovan   PAC  03/16/2013, 8:13 AM

## 2013-03-16 NOTE — Care Management Note (Signed)
    Page 1 of 1   03/16/2013     4:22:24 PM   CARE MANAGEMENT NOTE 03/16/2013  Patient:  [facility]   Account Number:  [id number]  Date Initiated:  03/16/2013  Documentation initiated by:  Colleen Can  Subjective/Objective Assessment:   dx left knee OA; total knee replacemnt     Action/Plan:   Plans are for SNF rehab.   Anticipated DC Date:  03/17/2013   Anticipated DC Plan:  SKILLED NURSING FACILITY  In-house referral  Clinical Social Worker      DC Planning Services  CM consult      Choice offered to / List presented to:             Status of service:  Completed, signed off Medicare Important Message given?   (If response is "NO", the following Medicare IM given date fields will be blank) Date Medicare IM given:   Date Additional Medicare IM given:    Discharge Disposition:    Per UR Regulation:    If discussed at Long Length of Stay Meetings, dates discussed:    Comments:

## 2013-03-16 NOTE — Progress Notes (Signed)
CSW is assisting with d/c planning to [facility]. SNF is working with [employer] for authorization. No decision made at this time by [facility]. CSW will continue to follow.  [name]  LCSW [phone]

## 2013-03-16 NOTE — Progress Notes (Signed)
Physical Therapy Treatment Patient Details Name: Jasmine Donovan MRN: [id number] DOB: 06-Sep-1950 Today's Date: 03/16/2013 Time: 4098-1191 PT Time Calculation (min): 25 min  PT Assessment / Plan / Recommendation  History of Present Illness L TKR    PT Comments   POD # 2 am session.  Assisted pt OOB to amb limited distance in hallway then perform TE's.  Pt progressing slowly and demon gait instability.  Pt will need ST SNF Rehab prior to returning home.   Follow Up Recommendations  SNF     Does the patient have the potential to tolerate intense rehabilitation     Barriers to Discharge        Equipment Recommendations       Recommendations for Other Services    Frequency 7X/week   Progress towards PT Goals Progress towards PT goals: Progressing toward goals  Plan      Precautions / Restrictions Precautions Precautions: Fall;Knee Restrictions Weight Bearing Restrictions: No Other Position/Activity Restrictions: WBAT    Pertinent Vitals/Pain C/o 8/10 with act meds requested ICE applied    Mobility  Bed Mobility Bed Mobility: Supine to Sit Supine to Sit: 4: Min assist Details for Bed Mobility Assistance: min cues for sequence and use of R LE to self assist plus increased time Transfers Transfers: Sit to Stand;Stand to Sit Sit to Stand: 4: Min assist;With upper extremity assist;From bed Stand to Sit: 4: Min assist;To chair/3-in-1;With upper extremity assist Details for Transfer Assistance: verbal cues for hand placement and LE management plus increased time Ambulation/Gait Ambulation/Gait Assistance: 4: Min guard;4: Min assist Ambulation Distance (Feet): 47 Feet Assistive device: Rolling walker Ambulation/Gait Assistance Details: decreased amb distance due to increased c/o pain Gait Pattern: Step-to pattern;Decreased step length - right;Decreased step length - left;Shuffle;Antalgic;Trunk flexed Gait velocity: decreased    Exercises   Total Knee Replacement TE's 10  reps B LE ankle pumps 10 reps knee presses 10 reps heel slides  10 reps SAQ's 10 reps SLR's 10 reps ABD Followed by ICE    PT Goals (current goals can now be found in the care plan section)    Visit Information  Last PT Received On: 03/16/13 Assistance Needed: +1 History of Present Illness: L TKR     Subjective Data      Cognition       Balance     End of Session PT - End of Session Equipment Utilized During Treatment: Gait belt Activity Tolerance: Patient limited by pain Patient left: in chair;with call bell/phone within reach;with family/visitor present;with nursing/sitter in room   [employer]  PTA [facility]  Acute  Rehab Pager      [phone]

## 2013-03-16 NOTE — Progress Notes (Signed)
   Subjective: 2 Days Post-Op Procedure(s) (LRB): LEFT TOTAL KNEE ARTHROPLASTY (Left)   Patient reports pain as mild, pain controlled. No events throughout the night.   Objective:   VITALS:   Filed Vitals:   03/16/13 0650  BP: 148/87  Pulse: 80  Temp: 97.7 F (36.5 C)  Resp: 16    Neurovascular intact Dorsiflexion/Plantar flexion intact Incision: dressing C/D/I No cellulitis present Compartment soft  LABS  Recent Labs  03/15/13 0447 03/16/13 0418  HGB 10.0* 9.5*  HCT 30.3* 29.1*  WBC 17.3* 11.2*  PLT 223 227     Recent Labs  03/15/13 0447 03/16/13 0418  NA 135 139  K 4.3 3.8  BUN 11 16  CREATININE 0.65 0.79  GLUCOSE 129* 117*     Assessment/Plan: 2 Days Post-Op Procedure(s) (LRB): LEFT TOTAL KNEE ARTHROPLASTY (Left) Up with therapy Discharge to SNF eventually, when ready  Expected ABLA  Treated with iron and will observe  Obese (BMI 30-39.9) Estimated body mass index is 30.1 kg/(m^2) as calculated from the following:   Height as of this encounter: 5\' 6"  (1.676 m).   Weight as of this encounter: 84.55 kg (186 lb 6.4 oz). Patient also counseled that weight may inhibit the healing process Patient counseled that losing weight will help with future health issues       Jasmine Donovan. Jasmine Donovan   PAC  03/16/2013, 8:11 AM
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
## 2013-03-17 NOTE — Progress Notes (Signed)
Physical Therapy Treatment Patient Details Name: Jasmine Donovan MRN: [id number] DOB: 1950-07-29 Today's Date: 03/17/2013 Time: 4098-1191 PT Time Calculation (min): 25 min  PT Assessment / Plan / Recommendation  History of Present Illness L TKR    PT Comments   POD # 3 assisted pt out of recliner to amb to BR to void then back to chair.  Performed TE's the applied ICE.  Follow Up Recommendations  SNF     Does the patient have the potential to tolerate intense rehabilitation     Barriers to Discharge        Equipment Recommendations  Rolling walker with 5" wheels    Recommendations for Other Services    Frequency 7X/week   Progress towards PT Goals Progress towards PT goals: Progressing toward goals  Plan      Precautions / Restrictions Precautions Precautions: Fall;Knee Restrictions Weight Bearing Restrictions: No Other Position/Activity Restrictions: WBAT    Pertinent Vitals/Pain C/o 7/10 knee pain after TE's ICE applied    Mobility  Bed Mobility Bed Mobility: Not assessed Details for Bed Mobility Assistance: Pt OOB in recliner Transfers Transfers: Sit to Stand;Stand to Sit Sit to Stand: 4: Min guard;4: Min assist;From chair/3-in-1;From toilet Stand to Sit: 4: Min guard;4: Min assist;To chair/3-in-1;To toilet Details for Transfer Assistance: VCs for safe hand placement plus increased time due to increased c/o pain "much more sore today" Ambulation/Gait Ambulation/Gait Assistance: 4: Min guard Ambulation Distance (Feet): 25 Feet Assistive device: Rolling walker Ambulation/Gait Assistance Details: increased time and <25% VC's safety with turns Gait Pattern: Step-to pattern;Decreased step length - right;Decreased step length - left;Shuffle;Antalgic;Trunk flexed Gait velocity: decreased Stairs: No    Exercises   Total Knee Replacement TE's 10 reps B LE ankle pumps 10 reps knee presses 10 reps heel slides  10 reps SAQ's 10 reps SLR's 10 reps ABD Followed by  ICE    PT Goals (current goals can now be found in the care plan section)    Visit Information  Last PT Received On: 03/17/13 Assistance Needed: +1 History of Present Illness: L TKR     Subjective Data      Cognition       Balance     End of Session PT - End of Session Equipment Utilized During Treatment: Gait belt Activity Tolerance: Patient limited by pain Patient left: in chair;with call bell/phone within reach;with family/visitor present   Felecia Shelling  PTA [facility]  Acute  Rehab Pager      [phone]

## 2013-03-17 NOTE — Progress Notes (Signed)
Occupational Therapy Treatment Patient Details Name: Jasmine Donovan MRN: [id number] DOB: 07-02-1950 Today's Date: 03/17/2013 Time: 7829-5621 OT Time Calculation (min): 18 min  OT Assessment / Plan / Recommendation  History of present illness L TKR    OT comments  Pt has met current Min guard A goals, so have updated them to S level.  Follow Up Recommendations  SNF;Supervision/Assistance - 24 hour       Equipment Recommendations  3 in 1 bedside comode       Frequency Min 2X/week   Progress towards OT Goals Progress towards OT goals: Goals met and updated - see care plan  Plan Discharge plan remains appropriate    Precautions / Restrictions Precautions Precautions: Fall;Knee Restrictions Other Position/Activity Restrictions: WBAT   Pertinent Vitals/Pain 6/10 LLE with ambulation; repositioned, ice applied, and RN made aware (not time for pain meds, I made pt aware)    ADL  Grooming: Wash/dry hands;Min guard Where Assessed - Grooming: Unsupported standing Lower Body Dressing: Min guard Where Assessed - Lower Body Dressing: Unsupported sit to stand Toilet Transfer: Min Pension scheme manager Method: Sit to Barista: Raised toilet seat with arms (or 3-in-1 over toilet) Toileting - Clothing Manipulation and Hygiene: Min guard Where Assessed - Toileting Clothing Manipulation and Hygiene: Sit to stand from 3-in-1 or toilet Equipment Used: Rolling walker ADL Comments: Pt needed reminding to keep RW in front of her as she turned to the sink to wash her hands as opposed to putting the RW off to the side.      OT Goals(current goals can now be found in the care plan section)    Visit Information  Last OT Received On: 03/17/13 Assistance Needed: +1 History of Present Illness: L TKR        Prior Functioning  Home Living Family/patient expects to be discharged to:: Skilled nursing facility Living Arrangements: Spouse/significant other Prior  Function Level of Independence: Independent Communication Communication: No difficulties Dominant Hand: Right    Cognition  Cognition Arousal/Alertness: Awake/alert Behavior During Therapy: WFL for tasks assessed/performed Overall Cognitive Status:  (decreased safety with RW)    Mobility  Bed Mobility Bed Mobility: Supine to Sit;Sitting - Scoot to Edge of Bed Supine to Sit: 5: Supervision;HOB elevated Sitting - Scoot to Edge of Bed: 5: Supervision;With rail Transfers Transfers: Sit to Stand;Stand to Sit Sit to Stand: 4: Min guard;With upper extremity assist;From bed Stand to Sit: 4: Min guard;With upper extremity assist;With armrests;To chair/3-in-1 Details for Transfer Assistance: VCs for safe hand placement          End of Session OT - End of Session Equipment Utilized During Treatment: Rolling walker Activity Tolerance: Patient tolerated treatment well Patient left: in chair Nurse Communication: Patient requests pain meds (Not time per RN and I made pt aware)       Evette Georges [phone] 03/17/2013, 8:38 AM

## 2013-03-17 NOTE — Progress Notes (Signed)
Report called to Woodfin Ganja

## 2013-03-17 NOTE — Progress Notes (Signed)
Physical Therapy Treatment Patient Details Name: Shequilla Goodgame MRN: [id number] DOB: 1950-06-18 Today's Date: 03/17/2013 Time: 4098-1191 PT Time Calculation (min): 13 min  PT Assessment / Plan / Recommendation  History of Present Illness L TKR    PT Comments   POD # 3 am session.  Pt OOB in recliner.  Assisted out of recliner to amb to BR when pt c/o increased pain "more sore than yesterday" with notable decreased tolerance to fully WB thru L LE.  Pt also required VC on safety with turns in tight spaces as pt went to turn upper body and reach beyond her BOS to wash hands at sink and nearly lost her balance.  HIGH FALL RISK. Pt will need ST Rehab at SNF prior to D/C to home due to gait instability/balance deficits and pain control.   Follow Up Recommendations  SNF     Does the patient have the potential to tolerate intense rehabilitation     Barriers to Discharge        Equipment Recommendations  Rolling walker with 5" wheels    Recommendations for Other Services    Frequency 7X/week   Progress towards PT Goals Progress towards PT goals: Progressing toward goals  Plan      Precautions / Restrictions Precautions Precautions: Fall;Knee Restrictions Weight Bearing Restrictions: No Other Position/Activity Restrictions: WBAT    Pertinent Vitals/Pain C/o 8/10 pain ICE applied Pre medicated    Mobility  Bed Mobility Bed Mobility: Not assessed Supine to Sit: 5: Supervision;HOB elevated Sitting - Scoot to Edge of Bed: 5: Supervision;With rail Details for Bed Mobility Assistance: Pt OOB in recliner Transfers Transfers: Sit to Stand;Stand to Sit Sit to Stand: 4: Min guard;4: Min assist;From chair/3-in-1;From toilet Stand to Sit: 4: Min guard;4: Min assist;To chair/3-in-1;To toilet Details for Transfer Assistance: VCs for safe hand placement plus increased time due to increased c/o pain "much more sore today" Ambulation/Gait Ambulation/Gait Assistance: 4: Min guard Ambulation  Distance (Feet): 22 Feet Assistive device: Rolling walker Ambulation/Gait Assistance Details: Unsteady gait with decreased tolerance to fully WB thru L LE due to increased c/o pain   Pt also required 25% VC's on safety with turns in tight spaces as pt went to turn in bathroom and reach beyond her BOS to wash hands and nearly lost her balance.   Gait Pattern: Step-to pattern;Decreased step length - right;Decreased step length - left;Shuffle;Antalgic;Trunk flexed Gait velocity: decreased     PT Goals (current goals can now be found in the care plan section)    Visit Information  Last PT Received On: 03/17/13 Assistance Needed: +1 History of Present Illness: L TKR     Subjective Data      Cognition  Cognition Arousal/Alertness: Awake/alert Behavior During Therapy: WFL for tasks assessed/performed Overall Cognitive Status:  (decreased safety with RW)    Balance     End of Session PT - End of Session Equipment Utilized During Treatment: Gait belt Activity Tolerance: Patient limited by pain Patient left: in chair;with call bell/phone within reach;with family/visitor present   Felecia Shelling  PTA [facility]  Acute  Rehab Pager      [phone]

## 2013-03-17 NOTE — Clinical Social Work Placement (Signed)
     Clinical Social Work Department CLINICAL SOCIAL WORK PLACEMENT NOTE 03/17/2013  Patient:  [facility]  Account Number:  [id number] Admit date:  03/14/2013  Clinical Social Worker:  [name], LCSW  Date/time:  03/15/2013 11:25 AM  Clinical Social Work is seeking post-discharge placement for this patient at the following level of care:   SKILLED NURSING   (*CSW will update this form in Epic as items are completed)     Patient/family provided with [facility] Department of Clinical Social Works list of facilities offering this level of care within the geographic area requested by the patient (or if unable, by the patients family).  03/15/2013  Patient/family informed of their freedom to choose among providers that offer the needed level of care, that participate in Medicare, Medicaid or managed care program needed by the patient, have an available bed and are willing to accept the patient.    Patient/family informed of [facility] ownership interest in [facility], as well as of the fact that they are under no obligation to receive care at this facility.  PASARR submitted to EDS on  PASARR number received from [employer] on 01/03/2013  FL2 transmitted to all facilities in geographic area requested by pt/family on  03/15/2013 FL2 transmitted to all facilities within larger geographic area on   Patient informed that his/her managed care company has contracts with or will negotiate with  certain facilities, including the following:     Patient/family informed of bed offers received:  03/15/2013 Patient chooses bed at [facility] Physician recommends and patient chooses bed at    Patient to be transferred to [facility] on  03/17/2013 Patient to be transferred to facility by family  The following physician request were entered in Epic:   Additional Comments:

## 2013-03-17 NOTE — Progress Notes (Addendum)
   Subjective: 3 Days Post-Op Procedure(s) (LRB): LEFT TOTAL KNEE ARTHROPLASTY (Left)   Patient reports pain as mild, controlled well with medication. No events throughout the night.  Ready to be discharged to SNF.  Objective:   VITALS:   Filed Vitals:   03/17/13 0552  BP: 158/79  Pulse: 76  Temp: 97.8 F (36.6 C)  Resp: 16    Neurovascular intact Dorsiflexion/Plantar flexion intact Incision: dressing C/D/I No cellulitis present Compartment soft  LABS  Recent Labs  03/15/13 0447 03/16/13 0418  HGB 10.0* 9.5*  HCT 30.3* 29.1*  WBC 17.3* 11.2*  PLT 223 227     Recent Labs  03/15/13 0447 03/16/13 0418  NA 135 139  K 4.3 3.8  BUN 11 16  CREATININE 0.65 0.79  GLUCOSE 129* 117*     Assessment/Plan: 3 Days Post-Op Procedure(s) (LRB): LEFT TOTAL KNEE ARTHROPLASTY (Left) Up with therapy Discharge to SNF Follow up in 2 weeks at [facility]. Follow up with OLIN,Aijah Lattner D in 2 weeks.  Contact information:  [facility] [address], [address] [place] [address] [phone]        Anastasio Auerbach. Reniyah Gootee   PAC  03/17/2013, 8:20 AM

## 2013-03-20 ENCOUNTER — Encounter: Payer: Self-pay | Admitting: Adult Health

## 2013-03-20 ENCOUNTER — Other Ambulatory Visit: Payer: Self-pay | Admitting: *Deleted

## 2013-03-20 ENCOUNTER — Non-Acute Institutional Stay (SKILLED_NURSING_FACILITY): Payer: BC Managed Care – PPO | Admitting: Adult Health

## 2013-03-20 DIAGNOSIS — Z96659 Presence of unspecified artificial knee joint: Secondary | ICD-10-CM

## 2013-03-20 DIAGNOSIS — M1712 Unilateral primary osteoarthritis, left knee: Secondary | ICD-10-CM

## 2013-03-20 DIAGNOSIS — E039 Hypothyroidism, unspecified: Secondary | ICD-10-CM

## 2013-03-20 DIAGNOSIS — E785 Hyperlipidemia, unspecified: Secondary | ICD-10-CM

## 2013-03-20 DIAGNOSIS — M1710 Unilateral primary osteoarthritis, unspecified knee: Secondary | ICD-10-CM

## 2013-03-20 DIAGNOSIS — IMO0002 Reserved for concepts with insufficient information to code with codable children: Secondary | ICD-10-CM

## 2013-03-20 DIAGNOSIS — Z96652 Presence of left artificial knee joint: Secondary | ICD-10-CM

## 2013-03-20 DIAGNOSIS — K5900 Constipation, unspecified: Secondary | ICD-10-CM

## 2013-03-20 MED ORDER — SENNA 8.6 MG PO TABS: 1.0000 | ORAL_TABLET | Freq: Two times a day (BID) | ORAL | Status: DC

## 2013-03-20 MED ORDER — MORPHINE SULFATE ER 15 MG PO TBCR: 15.0000 mg | EXTENDED_RELEASE_TABLET | Freq: Two times a day (BID) | ORAL | Status: DC

## 2013-03-20 MED ORDER — HYDROCODONE-ACETAMINOPHEN 7.5-325 MG PO TABS: ORAL_TABLET | ORAL | Status: DC
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
## 2013-03-22 ENCOUNTER — Encounter: Payer: Self-pay | Admitting: Adult Health

## 2013-03-22 ENCOUNTER — Non-Acute Institutional Stay: Payer: BC Managed Care – PPO | Admitting: Adult Health

## 2013-03-22 DIAGNOSIS — Z96652 Presence of left artificial knee joint: Secondary | ICD-10-CM

## 2013-03-22 DIAGNOSIS — Z96659 Presence of unspecified artificial knee joint: Secondary | ICD-10-CM

## 2013-03-22 DIAGNOSIS — M1712 Unilateral primary osteoarthritis, left knee: Secondary | ICD-10-CM

## 2013-03-22 DIAGNOSIS — M1710 Unilateral primary osteoarthritis, unspecified knee: Secondary | ICD-10-CM

## 2013-03-22 DIAGNOSIS — E669 Obesity, unspecified: Secondary | ICD-10-CM
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
## 2013-03-23 ENCOUNTER — Non-Acute Institutional Stay (SKILLED_NURSING_FACILITY): Payer: BC Managed Care – PPO | Admitting: Internal Medicine

## 2013-03-23 DIAGNOSIS — E785 Hyperlipidemia, unspecified: Secondary | ICD-10-CM

## 2013-03-23 DIAGNOSIS — E039 Hypothyroidism, unspecified: Secondary | ICD-10-CM

## 2013-03-23 DIAGNOSIS — K5900 Constipation, unspecified: Secondary | ICD-10-CM

## 2013-03-23 DIAGNOSIS — D500 Iron deficiency anemia secondary to blood loss (chronic): Secondary | ICD-10-CM

## 2013-03-23 DIAGNOSIS — M1712 Unilateral primary osteoarthritis, left knee: Secondary | ICD-10-CM

## 2013-03-23 DIAGNOSIS — M1710 Unilateral primary osteoarthritis, unspecified knee: Secondary | ICD-10-CM

## 2013-03-29 DIAGNOSIS — M1712 Unilateral primary osteoarthritis, left knee: Secondary | ICD-10-CM | POA: Insufficient documentation
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
## 2015-02-21 ENCOUNTER — Other Ambulatory Visit: Payer: Self-pay | Admitting: Endocrinology

## 2015-02-21 ENCOUNTER — Other Ambulatory Visit (HOSPITAL_COMMUNITY)
Admission: RE | Admit: 2015-02-21 | Discharge: 2015-02-21 | Disposition: A | Discharge status: Home / Self Care | Payer: BLUE CROSS/BLUE SHIELD | Source: Ambulatory Visit | Attending: Endocrinology | Admitting: Endocrinology

## 2015-02-21 DIAGNOSIS — Z01419 Encounter for gynecological examination (general) (routine) without abnormal findings: Secondary | ICD-10-CM | POA: Insufficient documentation

## 2015-02-25 LAB — CYTOLOGY - PAP

## 2017-02-22 DIAGNOSIS — E039 Hypothyroidism, unspecified: Secondary | ICD-10-CM | POA: Diagnosis not present

## 2017-02-22 DIAGNOSIS — E789 Disorder of lipoprotein metabolism, unspecified: Secondary | ICD-10-CM | POA: Diagnosis not present

## 2017-05-20 DIAGNOSIS — E789 Disorder of lipoprotein metabolism, unspecified: Secondary | ICD-10-CM | POA: Diagnosis not present

## 2017-05-20 DIAGNOSIS — E039 Hypothyroidism, unspecified: Secondary | ICD-10-CM | POA: Diagnosis not present

## 2017-05-28 DIAGNOSIS — E039 Hypothyroidism, unspecified: Secondary | ICD-10-CM | POA: Diagnosis not present

## 2017-05-28 DIAGNOSIS — R739 Hyperglycemia, unspecified: Secondary | ICD-10-CM | POA: Diagnosis not present

## 2017-05-28 DIAGNOSIS — E789 Disorder of lipoprotein metabolism, unspecified: Secondary | ICD-10-CM | POA: Diagnosis not present

## 2017-09-20 DIAGNOSIS — E039 Hypothyroidism, unspecified: Secondary | ICD-10-CM | POA: Diagnosis not present

## 2017-09-20 DIAGNOSIS — R739 Hyperglycemia, unspecified: Secondary | ICD-10-CM | POA: Diagnosis not present

## 2017-09-20 DIAGNOSIS — E789 Disorder of lipoprotein metabolism, unspecified: Secondary | ICD-10-CM | POA: Diagnosis not present

## 2017-11-23 DIAGNOSIS — E039 Hypothyroidism, unspecified: Secondary | ICD-10-CM | POA: Diagnosis not present

## 2017-11-23 DIAGNOSIS — R739 Hyperglycemia, unspecified: Secondary | ICD-10-CM | POA: Diagnosis not present

## 2017-12-22 DIAGNOSIS — Z1231 Encounter for screening mammogram for malignant neoplasm of breast: Secondary | ICD-10-CM | POA: Diagnosis not present

## 2017-12-22 DIAGNOSIS — Z803 Family history of malignant neoplasm of breast: Secondary | ICD-10-CM | POA: Diagnosis not present

## 2018-02-23 DIAGNOSIS — H04123 Dry eye syndrome of bilateral lacrimal glands: Secondary | ICD-10-CM | POA: Diagnosis not present

## 2018-02-23 DIAGNOSIS — H2513 Age-related nuclear cataract, bilateral: Secondary | ICD-10-CM | POA: Diagnosis not present

## 2018-02-23 DIAGNOSIS — H0589 Other disorders of orbit: Secondary | ICD-10-CM | POA: Diagnosis not present

## 2018-02-23 DIAGNOSIS — H43812 Vitreous degeneration, left eye: Secondary | ICD-10-CM | POA: Diagnosis not present

## 2018-02-23 DIAGNOSIS — H40013 Open angle with borderline findings, low risk, bilateral: Secondary | ICD-10-CM | POA: Diagnosis not present

## 2018-02-23 DIAGNOSIS — H05243 Constant exophthalmos, bilateral: Secondary | ICD-10-CM | POA: Diagnosis not present

## 2018-02-23 DIAGNOSIS — H31092 Other chorioretinal scars, left eye: Secondary | ICD-10-CM | POA: Diagnosis not present

## 2018-08-04 DIAGNOSIS — E039 Hypothyroidism, unspecified: Secondary | ICD-10-CM | POA: Diagnosis not present

## 2018-08-04 DIAGNOSIS — E789 Disorder of lipoprotein metabolism, unspecified: Secondary | ICD-10-CM | POA: Diagnosis not present

## 2018-08-04 DIAGNOSIS — Z0000 Encounter for general adult medical examination without abnormal findings: Secondary | ICD-10-CM | POA: Diagnosis not present

## 2018-08-04 DIAGNOSIS — Z5181 Encounter for therapeutic drug level monitoring: Secondary | ICD-10-CM | POA: Diagnosis not present

## 2018-08-10 DIAGNOSIS — R739 Hyperglycemia, unspecified: Secondary | ICD-10-CM | POA: Diagnosis not present

## 2018-08-10 DIAGNOSIS — E039 Hypothyroidism, unspecified: Secondary | ICD-10-CM | POA: Diagnosis not present

## 2018-08-10 DIAGNOSIS — M25561 Pain in right knee: Secondary | ICD-10-CM | POA: Diagnosis not present

## 2018-08-10 DIAGNOSIS — J309 Allergic rhinitis, unspecified: Secondary | ICD-10-CM | POA: Diagnosis not present

## 2018-08-10 DIAGNOSIS — E789 Disorder of lipoprotein metabolism, unspecified: Secondary | ICD-10-CM | POA: Diagnosis not present

## 2018-08-10 DIAGNOSIS — M1990 Unspecified osteoarthritis, unspecified site: Secondary | ICD-10-CM | POA: Diagnosis not present

## 2019-02-10 DIAGNOSIS — Z1231 Encounter for screening mammogram for malignant neoplasm of breast: Secondary | ICD-10-CM | POA: Diagnosis not present

## 2019-02-10 DIAGNOSIS — Z803 Family history of malignant neoplasm of breast: Secondary | ICD-10-CM | POA: Diagnosis not present

## 2019-03-08 DIAGNOSIS — E0500 Thyrotoxicosis with diffuse goiter without thyrotoxic crisis or storm: Secondary | ICD-10-CM | POA: Diagnosis not present

## 2019-03-08 DIAGNOSIS — H04123 Dry eye syndrome of bilateral lacrimal glands: Secondary | ICD-10-CM | POA: Diagnosis not present

## 2019-03-08 DIAGNOSIS — H0520 Unspecified exophthalmos: Secondary | ICD-10-CM | POA: Diagnosis not present

## 2019-03-08 DIAGNOSIS — H31092 Other chorioretinal scars, left eye: Secondary | ICD-10-CM | POA: Diagnosis not present

## 2019-03-08 DIAGNOSIS — H43813 Vitreous degeneration, bilateral: Secondary | ICD-10-CM | POA: Diagnosis not present

## 2019-03-08 DIAGNOSIS — H40013 Open angle with borderline findings, low risk, bilateral: Secondary | ICD-10-CM | POA: Diagnosis not present

## 2019-03-08 DIAGNOSIS — H2513 Age-related nuclear cataract, bilateral: Secondary | ICD-10-CM | POA: Diagnosis not present

## 2019-03-27 DIAGNOSIS — Z0000 Encounter for general adult medical examination without abnormal findings: Secondary | ICD-10-CM | POA: Diagnosis not present

## 2019-03-27 DIAGNOSIS — I10 Essential (primary) hypertension: Secondary | ICD-10-CM | POA: Diagnosis not present

## 2019-03-27 DIAGNOSIS — E7800 Pure hypercholesterolemia, unspecified: Secondary | ICD-10-CM | POA: Diagnosis not present

## 2019-03-27 DIAGNOSIS — R739 Hyperglycemia, unspecified: Secondary | ICD-10-CM | POA: Diagnosis not present

## 2019-04-04 DIAGNOSIS — E039 Hypothyroidism, unspecified: Secondary | ICD-10-CM | POA: Diagnosis not present

## 2019-04-04 DIAGNOSIS — Z23 Encounter for immunization: Secondary | ICD-10-CM | POA: Diagnosis not present

## 2019-04-04 DIAGNOSIS — I10 Essential (primary) hypertension: Secondary | ICD-10-CM | POA: Diagnosis not present

## 2019-04-04 DIAGNOSIS — R739 Hyperglycemia, unspecified: Secondary | ICD-10-CM | POA: Diagnosis not present

## 2019-06-26 DIAGNOSIS — R739 Hyperglycemia, unspecified: Secondary | ICD-10-CM | POA: Diagnosis not present

## 2019-06-26 DIAGNOSIS — I10 Essential (primary) hypertension: Secondary | ICD-10-CM | POA: Diagnosis not present

## 2019-06-26 DIAGNOSIS — E039 Hypothyroidism, unspecified: Secondary | ICD-10-CM | POA: Diagnosis not present

## 2019-08-10 DIAGNOSIS — R739 Hyperglycemia, unspecified: Secondary | ICD-10-CM | POA: Diagnosis not present

## 2019-08-10 DIAGNOSIS — E039 Hypothyroidism, unspecified: Secondary | ICD-10-CM | POA: Diagnosis not present

## 2019-08-10 DIAGNOSIS — I10 Essential (primary) hypertension: Secondary | ICD-10-CM | POA: Diagnosis not present

## 2019-08-10 DIAGNOSIS — M1990 Unspecified osteoarthritis, unspecified site: Secondary | ICD-10-CM | POA: Diagnosis not present

## 2019-11-16 DIAGNOSIS — M25511 Pain in right shoulder: Secondary | ICD-10-CM | POA: Diagnosis not present

## 2019-12-28 DIAGNOSIS — M25511 Pain in right shoulder: Secondary | ICD-10-CM | POA: Diagnosis not present

## 2020-01-11 DIAGNOSIS — I10 Essential (primary) hypertension: Secondary | ICD-10-CM | POA: Diagnosis not present

## 2020-01-11 DIAGNOSIS — R739 Hyperglycemia, unspecified: Secondary | ICD-10-CM | POA: Diagnosis not present

## 2020-01-11 DIAGNOSIS — E7800 Pure hypercholesterolemia, unspecified: Secondary | ICD-10-CM | POA: Diagnosis not present

## 2020-01-11 DIAGNOSIS — E039 Hypothyroidism, unspecified: Secondary | ICD-10-CM | POA: Diagnosis not present

## 2020-01-15 DIAGNOSIS — M25511 Pain in right shoulder: Secondary | ICD-10-CM | POA: Diagnosis not present

## 2020-01-18 DIAGNOSIS — M1990 Unspecified osteoarthritis, unspecified site: Secondary | ICD-10-CM | POA: Diagnosis not present

## 2020-01-18 DIAGNOSIS — R739 Hyperglycemia, unspecified: Secondary | ICD-10-CM | POA: Diagnosis not present

## 2020-01-18 DIAGNOSIS — E039 Hypothyroidism, unspecified: Secondary | ICD-10-CM | POA: Diagnosis not present

## 2020-01-18 DIAGNOSIS — M25561 Pain in right knee: Secondary | ICD-10-CM | POA: Diagnosis not present

## 2020-01-22 DIAGNOSIS — M75121 Complete rotator cuff tear or rupture of right shoulder, not specified as traumatic: Secondary | ICD-10-CM | POA: Diagnosis not present

## 2020-02-15 DIAGNOSIS — M7541 Impingement syndrome of right shoulder: Secondary | ICD-10-CM | POA: Diagnosis not present

## 2020-02-15 DIAGNOSIS — M19011 Primary osteoarthritis, right shoulder: Secondary | ICD-10-CM | POA: Diagnosis not present

## 2020-02-15 DIAGNOSIS — M66821 Spontaneous rupture of other tendons, right upper arm: Secondary | ICD-10-CM | POA: Diagnosis not present

## 2020-02-15 DIAGNOSIS — M25711 Osteophyte, right shoulder: Secondary | ICD-10-CM | POA: Diagnosis not present

## 2020-02-15 DIAGNOSIS — G8918 Other acute postprocedural pain: Secondary | ICD-10-CM | POA: Diagnosis not present

## 2020-02-15 DIAGNOSIS — M7551 Bursitis of right shoulder: Secondary | ICD-10-CM | POA: Diagnosis not present

## 2020-02-15 DIAGNOSIS — S46011A Strain of muscle(s) and tendon(s) of the rotator cuff of right shoulder, initial encounter: Secondary | ICD-10-CM | POA: Diagnosis not present

## 2020-02-15 DIAGNOSIS — M75121 Complete rotator cuff tear or rupture of right shoulder, not specified as traumatic: Secondary | ICD-10-CM | POA: Diagnosis not present

## 2020-02-22 DIAGNOSIS — M25511 Pain in right shoulder: Secondary | ICD-10-CM | POA: Diagnosis not present

## 2020-02-26 DIAGNOSIS — M25511 Pain in right shoulder: Secondary | ICD-10-CM | POA: Diagnosis not present

## 2020-02-28 DIAGNOSIS — Z4789 Encounter for other orthopedic aftercare: Secondary | ICD-10-CM | POA: Diagnosis not present

## 2020-03-06 DIAGNOSIS — M25511 Pain in right shoulder: Secondary | ICD-10-CM | POA: Diagnosis not present

## 2020-03-08 DIAGNOSIS — M25511 Pain in right shoulder: Secondary | ICD-10-CM | POA: Diagnosis not present

## 2020-03-12 DIAGNOSIS — M25511 Pain in right shoulder: Secondary | ICD-10-CM | POA: Diagnosis not present

## 2020-03-14 DIAGNOSIS — M25511 Pain in right shoulder: Secondary | ICD-10-CM | POA: Diagnosis not present

## 2020-03-19 DIAGNOSIS — M25511 Pain in right shoulder: Secondary | ICD-10-CM | POA: Diagnosis not present

## 2020-03-21 DIAGNOSIS — M25511 Pain in right shoulder: Secondary | ICD-10-CM | POA: Diagnosis not present

## 2020-04-15 DIAGNOSIS — M25511 Pain in right shoulder: Secondary | ICD-10-CM | POA: Diagnosis not present

## 2020-04-29 DIAGNOSIS — M25511 Pain in right shoulder: Secondary | ICD-10-CM | POA: Diagnosis not present

## 2020-05-06 DIAGNOSIS — M25511 Pain in right shoulder: Secondary | ICD-10-CM | POA: Diagnosis not present

## 2020-05-17 DIAGNOSIS — M25511 Pain in right shoulder: Secondary | ICD-10-CM | POA: Diagnosis not present

## 2020-12-25 DIAGNOSIS — K5730 Diverticulosis of large intestine without perforation or abscess without bleeding: Secondary | ICD-10-CM | POA: Diagnosis not present

## 2020-12-25 DIAGNOSIS — Z1211 Encounter for screening for malignant neoplasm of colon: Secondary | ICD-10-CM | POA: Diagnosis not present

## 2021-02-25 ENCOUNTER — Ambulatory Visit (HOSPITAL_COMMUNITY)
Admission: RE | Admit: 2021-02-25 | Discharge: 2021-02-25 | Disposition: A | Discharge status: Home / Self Care | Payer: Medicare PPO | Source: Ambulatory Visit | Attending: Internal Medicine | Admitting: Internal Medicine

## 2021-02-25 ENCOUNTER — Other Ambulatory Visit: Payer: Self-pay

## 2021-02-25 ENCOUNTER — Other Ambulatory Visit (HOSPITAL_COMMUNITY): Payer: Self-pay | Admitting: Sports Medicine

## 2021-02-25 DIAGNOSIS — M79661 Pain in right lower leg: Secondary | ICD-10-CM | POA: Insufficient documentation

## 2021-02-25 DIAGNOSIS — M7989 Other specified soft tissue disorders: Secondary | ICD-10-CM | POA: Diagnosis not present

## 2021-02-25 DIAGNOSIS — M79604 Pain in right leg: Secondary | ICD-10-CM

## 2021-02-25 DIAGNOSIS — M79605 Pain in left leg: Secondary | ICD-10-CM | POA: Diagnosis not present

## 2021-02-25 DIAGNOSIS — M79662 Pain in left lower leg: Secondary | ICD-10-CM | POA: Diagnosis not present

## 2021-03-11 DIAGNOSIS — I872 Venous insufficiency (chronic) (peripheral): Secondary | ICD-10-CM | POA: Diagnosis not present

## 2021-03-11 DIAGNOSIS — Z23 Encounter for immunization: Secondary | ICD-10-CM | POA: Diagnosis not present

## 2021-03-11 DIAGNOSIS — R600 Localized edema: Secondary | ICD-10-CM | POA: Diagnosis not present

## 2021-03-25 DIAGNOSIS — I872 Venous insufficiency (chronic) (peripheral): Secondary | ICD-10-CM | POA: Diagnosis not present

## 2021-03-25 DIAGNOSIS — R600 Localized edema: Secondary | ICD-10-CM | POA: Diagnosis not present

## 2021-03-25 DIAGNOSIS — L97211 Non-pressure chronic ulcer of right calf limited to breakdown of skin: Secondary | ICD-10-CM | POA: Diagnosis not present

## 2021-04-03 ENCOUNTER — Other Ambulatory Visit: Payer: Self-pay

## 2021-04-03 DIAGNOSIS — L729 Follicular cyst of the skin and subcutaneous tissue, unspecified: Secondary | ICD-10-CM

## 2021-04-03 DIAGNOSIS — R600 Localized edema: Secondary | ICD-10-CM | POA: Diagnosis not present

## 2021-04-03 DIAGNOSIS — L97211 Non-pressure chronic ulcer of right calf limited to breakdown of skin: Secondary | ICD-10-CM | POA: Diagnosis not present

## 2021-04-03 DIAGNOSIS — I872 Venous insufficiency (chronic) (peripheral): Secondary | ICD-10-CM | POA: Diagnosis not present

## 2021-04-04 ENCOUNTER — Ambulatory Visit
Admission: RE | Admit: 2021-04-04 | Discharge: 2021-04-04 | Disposition: A | Discharge status: Home / Self Care | Payer: Medicare PPO | Source: Ambulatory Visit

## 2021-04-04 DIAGNOSIS — L729 Follicular cyst of the skin and subcutaneous tissue, unspecified: Secondary | ICD-10-CM

## 2021-04-04 DIAGNOSIS — R2241 Localized swelling, mass and lump, right lower limb: Secondary | ICD-10-CM | POA: Diagnosis not present

## 2021-04-14 DIAGNOSIS — E782 Mixed hyperlipidemia: Secondary | ICD-10-CM | POA: Diagnosis not present

## 2021-04-14 DIAGNOSIS — N182 Chronic kidney disease, stage 2 (mild): Secondary | ICD-10-CM | POA: Diagnosis not present

## 2021-04-14 DIAGNOSIS — M7989 Other specified soft tissue disorders: Secondary | ICD-10-CM | POA: Diagnosis not present

## 2021-04-14 DIAGNOSIS — R7303 Prediabetes: Secondary | ICD-10-CM | POA: Diagnosis not present

## 2021-06-17 DIAGNOSIS — E782 Mixed hyperlipidemia: Secondary | ICD-10-CM | POA: Diagnosis not present

## 2021-06-17 DIAGNOSIS — N182 Chronic kidney disease, stage 2 (mild): Secondary | ICD-10-CM | POA: Diagnosis not present

## 2021-06-17 DIAGNOSIS — R7303 Prediabetes: Secondary | ICD-10-CM | POA: Diagnosis not present

## 2021-06-24 DIAGNOSIS — N182 Chronic kidney disease, stage 2 (mild): Secondary | ICD-10-CM | POA: Diagnosis not present

## 2021-06-24 DIAGNOSIS — R7303 Prediabetes: Secondary | ICD-10-CM | POA: Diagnosis not present

## 2021-06-24 DIAGNOSIS — Q178 Other specified congenital malformations of ear: Secondary | ICD-10-CM | POA: Diagnosis not present

## 2021-06-24 DIAGNOSIS — E782 Mixed hyperlipidemia: Secondary | ICD-10-CM | POA: Diagnosis not present

## 2021-10-03 DIAGNOSIS — S01311A Laceration without foreign body of right ear, initial encounter: Secondary | ICD-10-CM | POA: Diagnosis not present

## 2021-12-24 DIAGNOSIS — E0500 Thyrotoxicosis with diffuse goiter without thyrotoxic crisis or storm: Secondary | ICD-10-CM | POA: Diagnosis not present

## 2021-12-24 DIAGNOSIS — H0520 Unspecified exophthalmos: Secondary | ICD-10-CM | POA: Diagnosis not present

## 2021-12-24 DIAGNOSIS — H43813 Vitreous degeneration, bilateral: Secondary | ICD-10-CM | POA: Diagnosis not present

## 2021-12-24 DIAGNOSIS — H40013 Open angle with borderline findings, low risk, bilateral: Secondary | ICD-10-CM | POA: Diagnosis not present

## 2021-12-24 DIAGNOSIS — H04123 Dry eye syndrome of bilateral lacrimal glands: Secondary | ICD-10-CM | POA: Diagnosis not present

## 2021-12-24 DIAGNOSIS — H25813 Combined forms of age-related cataract, bilateral: Secondary | ICD-10-CM | POA: Diagnosis not present

## 2021-12-24 DIAGNOSIS — H31092 Other chorioretinal scars, left eye: Secondary | ICD-10-CM | POA: Diagnosis not present

## 2022-01-07 DIAGNOSIS — N182 Chronic kidney disease, stage 2 (mild): Secondary | ICD-10-CM | POA: Diagnosis not present

## 2022-01-07 DIAGNOSIS — R5383 Other fatigue: Secondary | ICD-10-CM | POA: Diagnosis not present

## 2022-01-07 DIAGNOSIS — E782 Mixed hyperlipidemia: Secondary | ICD-10-CM | POA: Diagnosis not present

## 2022-01-07 DIAGNOSIS — R7303 Prediabetes: Secondary | ICD-10-CM | POA: Diagnosis not present

## 2022-01-14 DIAGNOSIS — Z0000 Encounter for general adult medical examination without abnormal findings: Secondary | ICD-10-CM | POA: Diagnosis not present

## 2022-01-14 DIAGNOSIS — Z23 Encounter for immunization: Secondary | ICD-10-CM | POA: Diagnosis not present

## 2022-01-20 DIAGNOSIS — N182 Chronic kidney disease, stage 2 (mild): Secondary | ICD-10-CM | POA: Diagnosis not present

## 2022-01-20 DIAGNOSIS — E039 Hypothyroidism, unspecified: Secondary | ICD-10-CM | POA: Diagnosis not present

## 2022-01-20 DIAGNOSIS — R059 Cough, unspecified: Secondary | ICD-10-CM | POA: Diagnosis not present

## 2022-01-20 DIAGNOSIS — Z0000 Encounter for general adult medical examination without abnormal findings: Secondary | ICD-10-CM | POA: Diagnosis not present

## 2022-01-20 DIAGNOSIS — R7303 Prediabetes: Secondary | ICD-10-CM | POA: Diagnosis not present

## 2022-05-13 ENCOUNTER — Other Ambulatory Visit: Payer: Self-pay | Admitting: Otolaryngology

## 2022-05-13 DIAGNOSIS — R1310 Dysphagia, unspecified: Secondary | ICD-10-CM

## 2022-05-13 DIAGNOSIS — J309 Allergic rhinitis, unspecified: Secondary | ICD-10-CM | POA: Diagnosis not present

## 2022-05-13 DIAGNOSIS — R053 Chronic cough: Secondary | ICD-10-CM | POA: Diagnosis not present

## 2022-05-13 DIAGNOSIS — K219 Gastro-esophageal reflux disease without esophagitis: Secondary | ICD-10-CM | POA: Diagnosis not present
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
## 2022-05-28 ENCOUNTER — Ambulatory Visit: Payer: Medicare PPO | Attending: Otolaryngology | Admitting: Speech Pathology

## 2022-05-28 DIAGNOSIS — R053 Chronic cough: Secondary | ICD-10-CM | POA: Diagnosis not present

## 2022-05-28 DIAGNOSIS — R490 Dysphonia: Secondary | ICD-10-CM | POA: Insufficient documentation

## 2022-05-28 DIAGNOSIS — R1310 Dysphagia, unspecified: Secondary | ICD-10-CM | POA: Diagnosis not present

## 2022-05-28 NOTE — Patient Instructions (Addendum)
Begin keeping a log to track when coughing occurs, such as location, time, environmental factors  When it happened What was I doing?  How long did it last? Is there any thing I think caused my cough?  Did anything help stop the cough? Sniff and blow breathing or hard swallow with or without water

## 2022-06-01 ENCOUNTER — Other Ambulatory Visit (HOSPITAL_COMMUNITY): Payer: Self-pay

## 2022-06-01 DIAGNOSIS — R1310 Dysphagia, unspecified: Secondary | ICD-10-CM

## 2022-06-05 ENCOUNTER — Ambulatory Visit: Payer: Medicare PPO | Attending: Otolaryngology | Admitting: Speech Pathology

## 2022-06-05 DIAGNOSIS — R053 Chronic cough: Secondary | ICD-10-CM | POA: Diagnosis not present

## 2022-06-05 DIAGNOSIS — R490 Dysphonia: Secondary | ICD-10-CM | POA: Diagnosis not present

## 2022-06-05 DIAGNOSIS — R1310 Dysphagia, unspecified: Secondary | ICD-10-CM | POA: Insufficient documentation
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
## 2022-06-05 NOTE — Patient Instructions (Signed)
When you feel a tickle in your throat.Marland KitchenMarland Kitchen  1) Drink water 2) Hard swallow 3) Breath in through nose, out through pursed lips  Our goal is that when you feel the sensation, fight through the feeling and replace it with one of the above strategies!

## 2022-06-11 ENCOUNTER — Ambulatory Visit (HOSPITAL_COMMUNITY)
Admission: RE | Admit: 2022-06-11 | Discharge: 2022-06-11 | Disposition: A | Discharge status: Home / Self Care | Payer: Medicare PPO | Source: Ambulatory Visit | Attending: Endocrinology | Admitting: Endocrinology

## 2022-06-11 DIAGNOSIS — R1314 Dysphagia, pharyngoesophageal phase: Secondary | ICD-10-CM | POA: Diagnosis not present

## 2022-06-11 DIAGNOSIS — R1310 Dysphagia, unspecified: Secondary | ICD-10-CM

## 2022-06-11 DIAGNOSIS — Z923 Personal history of irradiation: Secondary | ICD-10-CM | POA: Diagnosis not present

## 2022-06-11 DIAGNOSIS — T17320A Food in larynx causing asphyxiation, initial encounter: Secondary | ICD-10-CM | POA: Diagnosis not present

## 2022-06-11 DIAGNOSIS — R6339 Other feeding difficulties: Secondary | ICD-10-CM | POA: Diagnosis not present

## 2022-06-11 DIAGNOSIS — R053 Chronic cough: Secondary | ICD-10-CM

## 2022-06-12 ENCOUNTER — Ambulatory Visit: Payer: Medicare PPO | Admitting: Speech Pathology

## 2022-06-12 DIAGNOSIS — R1310 Dysphagia, unspecified: Secondary | ICD-10-CM

## 2022-06-12 DIAGNOSIS — R053 Chronic cough: Secondary | ICD-10-CM

## 2022-06-12 DIAGNOSIS — R490 Dysphonia: Secondary | ICD-10-CM | POA: Diagnosis not present

## 2022-06-12 NOTE — Patient Instructions (Signed)
   ACID REFLUX PRECAUTIONS ACID REFLUX can be a possible cause of a voice disorder or throat irritation. Acid reflux is a disorder where acid from your stomach is abnormally spilled over onto your voice box after eating, during sleep, or even during singing. Acid reflux causes irritation and inflammation to your vocal folds and should be avoided and treated by changing eating habits, changing lifestyle, and taking medication (if prescribed by your doctor).   CHANGE EATING HABITS   Avoid "trigger" foods. Certain foods and drinks can trigger acid reflux.  1. Caffeine- in coffee, tea, chocolate, sodas  2. Carbonated beverages  3. Mint and menthol  4. Fatty/fried foods  5. Citrus fruits  6. Tomato products  7. Spicy foods  8. Alcohol    CHANGING LIFESTYLE HABITS   Drink 8 glasses of water per day (64oz)    Stop smoking   Avoid clearing your throat   Allow 3 hours between last big meal and going to bed at night   Keep yourself upright for one hour after you eat   Elevate the head of your bed using 6-inch blocks under the head of the bed or a bed wedge between the box spring and the mattress.   Eat small meals throughout the day rather than 3 big meals   Eat slowly   Wear loose clothing   TAKING MEDICATION   If prescribed one time a day, take 15-30 minutes before breakfast   If prescribed two times a day, take 15-30 minutes before breakfast and 15- 30 minutes before dinner   CHANGING THE WAY YOU USE YOUR VOICE  "Best voice/ Least effort"   Set 5-10 minute timer and practice breathing through the urge to cough.  Coughing doesn't actually clear the sensation!
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
## 2022-06-19 ENCOUNTER — Ambulatory Visit: Payer: Medicare PPO | Admitting: Speech Pathology

## 2022-06-19 DIAGNOSIS — R490 Dysphonia: Secondary | ICD-10-CM | POA: Diagnosis not present

## 2022-06-19 DIAGNOSIS — R1310 Dysphagia, unspecified: Secondary | ICD-10-CM

## 2022-06-19 DIAGNOSIS — R053 Chronic cough: Secondary | ICD-10-CM | POA: Diagnosis not present

## 2022-07-02 DIAGNOSIS — K219 Gastro-esophageal reflux disease without esophagitis: Secondary | ICD-10-CM | POA: Diagnosis not present

## 2022-07-02 DIAGNOSIS — R053 Chronic cough: Secondary | ICD-10-CM | POA: Diagnosis not present

## 2022-07-02 DIAGNOSIS — R1310 Dysphagia, unspecified: Secondary | ICD-10-CM | POA: Diagnosis not present

## 2022-09-17 DIAGNOSIS — K219 Gastro-esophageal reflux disease without esophagitis: Secondary | ICD-10-CM | POA: Diagnosis not present

## 2022-09-17 DIAGNOSIS — R1310 Dysphagia, unspecified: Secondary | ICD-10-CM | POA: Diagnosis not present

## 2022-09-17 DIAGNOSIS — R053 Chronic cough: Secondary | ICD-10-CM | POA: Diagnosis not present

## 2022-11-09 DIAGNOSIS — Z1231 Encounter for screening mammogram for malignant neoplasm of breast: Secondary | ICD-10-CM | POA: Diagnosis not present

## 2022-11-13 ENCOUNTER — Other Ambulatory Visit
Admission: RE | Admit: 2022-11-13 | Discharge: 2022-11-13 | Disposition: A | Discharge status: Home / Self Care | Payer: Medicare PPO | Source: Ambulatory Visit | Attending: Family Medicine | Admitting: Family Medicine

## 2022-11-13 DIAGNOSIS — M7989 Other specified soft tissue disorders: Secondary | ICD-10-CM | POA: Diagnosis not present

## 2022-11-13 DIAGNOSIS — R600 Localized edema: Secondary | ICD-10-CM | POA: Insufficient documentation

## 2022-11-13 DIAGNOSIS — L539 Erythematous condition, unspecified: Secondary | ICD-10-CM | POA: Diagnosis not present

## 2022-11-14 LAB — BRAIN NATRIURETIC PEPTIDE: B Natriuretic Peptide: 74.2 pg/mL (ref 0.0–100.0)

## 2022-11-17 DIAGNOSIS — R7303 Prediabetes: Secondary | ICD-10-CM | POA: Diagnosis not present

## 2022-11-17 DIAGNOSIS — R600 Localized edema: Secondary | ICD-10-CM | POA: Diagnosis not present

## 2022-11-17 DIAGNOSIS — I10 Essential (primary) hypertension: Secondary | ICD-10-CM | POA: Diagnosis not present

## 2022-11-17 DIAGNOSIS — N182 Chronic kidney disease, stage 2 (mild): Secondary | ICD-10-CM | POA: Diagnosis not present

## 2022-11-18 DIAGNOSIS — M7989 Other specified soft tissue disorders: Secondary | ICD-10-CM | POA: Diagnosis not present

## 2022-11-18 DIAGNOSIS — I80201 Phlebitis and thrombophlebitis of unspecified deep vessels of right lower extremity: Secondary | ICD-10-CM | POA: Diagnosis not present

## 2022-11-18 DIAGNOSIS — I890 Lymphedema, not elsewhere classified: Secondary | ICD-10-CM | POA: Diagnosis not present

## 2022-11-18 DIAGNOSIS — I83811 Varicose veins of right lower extremities with pain: Secondary | ICD-10-CM | POA: Diagnosis not present

## 2022-11-25 DIAGNOSIS — I872 Venous insufficiency (chronic) (peripheral): Secondary | ICD-10-CM | POA: Diagnosis not present

## 2022-11-25 DIAGNOSIS — I83891 Varicose veins of right lower extremities with other complications: Secondary | ICD-10-CM | POA: Diagnosis not present

## 2022-12-23 DIAGNOSIS — R600 Localized edema: Secondary | ICD-10-CM | POA: Diagnosis not present

## 2022-12-23 DIAGNOSIS — I872 Venous insufficiency (chronic) (peripheral): Secondary | ICD-10-CM | POA: Diagnosis not present

## 2022-12-23 DIAGNOSIS — I890 Lymphedema, not elsewhere classified: Secondary | ICD-10-CM | POA: Diagnosis not present

## 2022-12-23 DIAGNOSIS — I83891 Varicose veins of right lower extremities with other complications: Secondary | ICD-10-CM | POA: Diagnosis not present

## 2022-12-31 DIAGNOSIS — H40013 Open angle with borderline findings, low risk, bilateral: Secondary | ICD-10-CM | POA: Diagnosis not present

## 2022-12-31 DIAGNOSIS — H43813 Vitreous degeneration, bilateral: Secondary | ICD-10-CM | POA: Diagnosis not present

## 2022-12-31 DIAGNOSIS — H04123 Dry eye syndrome of bilateral lacrimal glands: Secondary | ICD-10-CM | POA: Diagnosis not present

## 2022-12-31 DIAGNOSIS — H31092 Other chorioretinal scars, left eye: Secondary | ICD-10-CM | POA: Diagnosis not present

## 2022-12-31 DIAGNOSIS — H25813 Combined forms of age-related cataract, bilateral: Secondary | ICD-10-CM | POA: Diagnosis not present

## 2022-12-31 DIAGNOSIS — H0520 Unspecified exophthalmos: Secondary | ICD-10-CM | POA: Diagnosis not present

## 2022-12-31 DIAGNOSIS — E0500 Thyrotoxicosis with diffuse goiter without thyrotoxic crisis or storm: Secondary | ICD-10-CM | POA: Diagnosis not present

## 2023-01-04 ENCOUNTER — Encounter: Payer: Self-pay | Admitting: Podiatry

## 2023-01-04 ENCOUNTER — Ambulatory Visit: Payer: Medicare PPO | Admitting: Podiatry

## 2023-01-04 DIAGNOSIS — M79674 Pain in right toe(s): Secondary | ICD-10-CM | POA: Diagnosis not present

## 2023-01-04 DIAGNOSIS — M79675 Pain in left toe(s): Secondary | ICD-10-CM

## 2023-01-04 DIAGNOSIS — L03031 Cellulitis of right toe: Secondary | ICD-10-CM | POA: Diagnosis not present

## 2023-01-04 DIAGNOSIS — L02611 Cutaneous abscess of right foot: Secondary | ICD-10-CM

## 2023-01-04 DIAGNOSIS — L602 Onychogryphosis: Secondary | ICD-10-CM

## 2023-01-04 DIAGNOSIS — B351 Tinea unguium: Secondary | ICD-10-CM | POA: Diagnosis not present

## 2023-01-04 MED ORDER — CEPHALEXIN 250 MG/5ML PO SUSR: 500.0000 mg | Freq: Two times a day (BID) | ORAL | Status: DC

## 2023-01-04 MED ORDER — CEPHALEXIN 250 MG/5ML PO SUSR: 500.0000 mg | Freq: Two times a day (BID) | ORAL | 0 refills | Status: AC
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
## 2023-01-18 ENCOUNTER — Encounter: Payer: Self-pay | Admitting: Podiatry

## 2023-01-18 ENCOUNTER — Ambulatory Visit: Payer: Medicare PPO | Admitting: Podiatry

## 2023-01-18 VITALS — BP 128/72 | HR 74

## 2023-01-18 DIAGNOSIS — L03031 Cellulitis of right toe: Secondary | ICD-10-CM | POA: Diagnosis not present

## 2023-01-18 DIAGNOSIS — L02611 Cutaneous abscess of right foot: Secondary | ICD-10-CM | POA: Diagnosis not present
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
## 2023-02-03 DIAGNOSIS — K449 Diaphragmatic hernia without obstruction or gangrene: Secondary | ICD-10-CM | POA: Diagnosis not present

## 2023-02-03 DIAGNOSIS — R1310 Dysphagia, unspecified: Secondary | ICD-10-CM | POA: Diagnosis not present

## 2023-02-03 DIAGNOSIS — K222 Esophageal obstruction: Secondary | ICD-10-CM | POA: Diagnosis not present

## 2023-02-03 DIAGNOSIS — R053 Chronic cough: Secondary | ICD-10-CM | POA: Diagnosis not present

## 2023-02-03 DIAGNOSIS — Z79899 Other long term (current) drug therapy: Secondary | ICD-10-CM | POA: Diagnosis not present

## 2023-02-03 DIAGNOSIS — E785 Hyperlipidemia, unspecified: Secondary | ICD-10-CM | POA: Diagnosis not present

## 2023-02-03 DIAGNOSIS — K2270 Barrett's esophagus without dysplasia: Secondary | ICD-10-CM | POA: Diagnosis not present

## 2023-02-03 DIAGNOSIS — N182 Chronic kidney disease, stage 2 (mild): Secondary | ICD-10-CM | POA: Diagnosis not present

## 2023-02-03 DIAGNOSIS — I129 Hypertensive chronic kidney disease with stage 1 through stage 4 chronic kidney disease, or unspecified chronic kidney disease: Secondary | ICD-10-CM | POA: Diagnosis not present

## 2023-02-03 DIAGNOSIS — K219 Gastro-esophageal reflux disease without esophagitis: Secondary | ICD-10-CM | POA: Diagnosis not present

## 2023-02-17 DIAGNOSIS — I872 Venous insufficiency (chronic) (peripheral): Secondary | ICD-10-CM | POA: Diagnosis not present

## 2023-03-02 DIAGNOSIS — I890 Lymphedema, not elsewhere classified: Secondary | ICD-10-CM | POA: Diagnosis not present

## 2023-03-02 DIAGNOSIS — E039 Hypothyroidism, unspecified: Secondary | ICD-10-CM | POA: Diagnosis not present

## 2023-03-02 DIAGNOSIS — R7303 Prediabetes: Secondary | ICD-10-CM | POA: Diagnosis not present

## 2023-03-02 DIAGNOSIS — N182 Chronic kidney disease, stage 2 (mild): Secondary | ICD-10-CM | POA: Diagnosis not present

## 2023-03-02 DIAGNOSIS — R600 Localized edema: Secondary | ICD-10-CM | POA: Diagnosis not present

## 2023-03-02 DIAGNOSIS — E7800 Pure hypercholesterolemia, unspecified: Secondary | ICD-10-CM | POA: Diagnosis not present

## 2023-03-08 DIAGNOSIS — Z0000 Encounter for general adult medical examination without abnormal findings: Secondary | ICD-10-CM | POA: Diagnosis not present

## 2023-03-08 DIAGNOSIS — Z23 Encounter for immunization: Secondary | ICD-10-CM | POA: Diagnosis not present

## 2023-04-14 DIAGNOSIS — I872 Venous insufficiency (chronic) (peripheral): Secondary | ICD-10-CM | POA: Diagnosis not present

## 2023-10-18 DIAGNOSIS — I10 Essential (primary) hypertension: Secondary | ICD-10-CM | POA: Diagnosis not present

## 2023-10-18 DIAGNOSIS — E039 Hypothyroidism, unspecified: Secondary | ICD-10-CM | POA: Diagnosis not present

## 2023-10-18 DIAGNOSIS — N182 Chronic kidney disease, stage 2 (mild): Secondary | ICD-10-CM | POA: Diagnosis not present

## 2023-10-18 DIAGNOSIS — R7303 Prediabetes: Secondary | ICD-10-CM | POA: Diagnosis not present

## 2023-10-18 DIAGNOSIS — E782 Mixed hyperlipidemia: Secondary | ICD-10-CM | POA: Diagnosis not present

## 2023-10-25 DIAGNOSIS — I10 Essential (primary) hypertension: Secondary | ICD-10-CM | POA: Diagnosis not present

## 2023-10-25 DIAGNOSIS — R7303 Prediabetes: Secondary | ICD-10-CM | POA: Diagnosis not present

## 2023-10-25 DIAGNOSIS — N182 Chronic kidney disease, stage 2 (mild): Secondary | ICD-10-CM | POA: Diagnosis not present

## 2023-10-25 DIAGNOSIS — E782 Mixed hyperlipidemia: Secondary | ICD-10-CM | POA: Diagnosis not present

## 2023-11-01 DIAGNOSIS — I83892 Varicose veins of left lower extremities with other complications: Secondary | ICD-10-CM | POA: Diagnosis not present

## 2023-11-01 DIAGNOSIS — R58 Hemorrhage, not elsewhere classified: Secondary | ICD-10-CM | POA: Diagnosis not present

## 2023-11-15 DIAGNOSIS — Z1231 Encounter for screening mammogram for malignant neoplasm of breast: Secondary | ICD-10-CM | POA: Diagnosis not present

## 2024-02-22 DIAGNOSIS — H0520 Unspecified exophthalmos: Secondary | ICD-10-CM | POA: Diagnosis not present

## 2024-02-22 DIAGNOSIS — H25813 Combined forms of age-related cataract, bilateral: Secondary | ICD-10-CM | POA: Diagnosis not present

## 2024-02-22 DIAGNOSIS — H04123 Dry eye syndrome of bilateral lacrimal glands: Secondary | ICD-10-CM | POA: Diagnosis not present

## 2024-02-22 DIAGNOSIS — H43813 Vitreous degeneration, bilateral: Secondary | ICD-10-CM | POA: Diagnosis not present

## 2024-02-22 DIAGNOSIS — H31092 Other chorioretinal scars, left eye: Secondary | ICD-10-CM | POA: Diagnosis not present

## 2024-02-22 DIAGNOSIS — E0500 Thyrotoxicosis with diffuse goiter without thyrotoxic crisis or storm: Secondary | ICD-10-CM | POA: Diagnosis not present

## 2024-02-22 DIAGNOSIS — H40013 Open angle with borderline findings, low risk, bilateral: Secondary | ICD-10-CM | POA: Diagnosis not present

## 2024-03-06 DIAGNOSIS — I10 Essential (primary) hypertension: Secondary | ICD-10-CM | POA: Diagnosis not present

## 2024-03-06 DIAGNOSIS — E039 Hypothyroidism, unspecified: Secondary | ICD-10-CM | POA: Diagnosis not present

## 2024-03-06 DIAGNOSIS — E782 Mixed hyperlipidemia: Secondary | ICD-10-CM | POA: Diagnosis not present

## 2024-03-06 DIAGNOSIS — R7303 Prediabetes: Secondary | ICD-10-CM | POA: Diagnosis not present

## 2024-03-06 DIAGNOSIS — N182 Chronic kidney disease, stage 2 (mild): Secondary | ICD-10-CM | POA: Diagnosis not present
